# Patient Record
Sex: Female | Born: 1960
Health system: Southern US, Community
[De-identification: ages and names within clinical notes are randomized; demographics above are authoritative.]

## PROBLEM LIST (undated history)

## (undated) DIAGNOSIS — A159 Respiratory tuberculosis unspecified: Secondary | ICD-10-CM

## (undated) DIAGNOSIS — R569 Unspecified convulsions: Secondary | ICD-10-CM

## (undated) DIAGNOSIS — G8929 Other chronic pain: Secondary | ICD-10-CM

## (undated) DIAGNOSIS — I10 Essential (primary) hypertension: Secondary | ICD-10-CM

## (undated) HISTORY — DX: Respiratory tuberculosis unspecified: A15.9

## (undated) HISTORY — DX: Unspecified convulsions: R56.9

## (undated) HISTORY — DX: Other chronic pain: G89.29

---

## 2010-11-15 ENCOUNTER — Ambulatory Visit
Admission: RE | Admit: 2010-11-15 | Discharge: 2010-11-15 | Disposition: A | Discharge status: Home / Self Care | Payer: PRIVATE HEALTH INSURANCE | Source: Ambulatory Visit | Attending: Infectious Diseases | Admitting: Infectious Diseases

## 2010-11-15 ENCOUNTER — Other Ambulatory Visit: Payer: Self-pay | Admitting: Infectious Diseases

## 2010-11-15 DIAGNOSIS — R7611 Nonspecific reaction to tuberculin skin test without active tuberculosis: Secondary | ICD-10-CM

## 2010-12-01 ENCOUNTER — Emergency Department (HOSPITAL_COMMUNITY): Payer: Medicaid Other

## 2010-12-01 ENCOUNTER — Emergency Department (HOSPITAL_COMMUNITY)
Admission: EM | Admit: 2010-12-01 | Discharge: 2010-12-01 | Disposition: A | Discharge status: Home / Self Care | Payer: Medicaid Other | Attending: Emergency Medicine | Admitting: Emergency Medicine

## 2010-12-01 DIAGNOSIS — R0602 Shortness of breath: Secondary | ICD-10-CM | POA: Insufficient documentation

## 2010-12-01 DIAGNOSIS — R0789 Other chest pain: Secondary | ICD-10-CM | POA: Insufficient documentation

## 2010-12-01 DIAGNOSIS — M79609 Pain in unspecified limb: Secondary | ICD-10-CM | POA: Insufficient documentation

## 2010-12-01 LAB — CBC
HCT: 37.1 % (ref 36.0–46.0)
Hemoglobin: 13.2 g/dL (ref 12.0–15.0)
MCV: 92.8 fL (ref 78.0–100.0)
RBC: 4 MIL/uL (ref 3.87–5.11)
WBC: 8.4 10*3/uL (ref 4.0–10.5)

## 2010-12-01 LAB — COMPREHENSIVE METABOLIC PANEL
CO2: 25 mEq/L (ref 19–32)
Calcium: 8.9 mg/dL (ref 8.4–10.5)
Creatinine, Ser: 0.63 mg/dL (ref 0.50–1.10)
GFR calc Af Amer: >60 mL/min (ref >60)
GFR calc non Af Amer: >60 mL/min (ref >60)
Glucose, Bld: 100 mg/dL — ABNORMAL HIGH (ref 70–99)

## 2010-12-01 LAB — D-DIMER, QUANTITATIVE: D-Dimer, Quant: <0.22 ug/mL-FEU (ref 0.00–0.48)

## 2010-12-01 LAB — CK TOTAL AND CKMB (NOT AT ARMC): Relative Index: INVALID (ref 0.0–2.5)

## 2010-12-01 LAB — TROPONIN I: Troponin I: <0.3 ng/mL (ref <0.30)

## 2011-04-22 ENCOUNTER — Other Ambulatory Visit: Payer: Self-pay | Admitting: Internal Medicine

## 2011-04-22 DIAGNOSIS — Z1231 Encounter for screening mammogram for malignant neoplasm of breast: Secondary | ICD-10-CM

## 2011-05-10 ENCOUNTER — Ambulatory Visit
Admission: RE | Admit: 2011-05-10 | Discharge: 2011-05-10 | Disposition: A | Discharge status: Home / Self Care | Payer: Medicaid Other | Source: Ambulatory Visit | Attending: Internal Medicine | Admitting: Internal Medicine

## 2011-05-10 DIAGNOSIS — Z1231 Encounter for screening mammogram for malignant neoplasm of breast: Secondary | ICD-10-CM

## 2012-08-07 IMAGING — CR DG CHEST 1V PORT
1 series · 1 of 1 positions shown · non-contrast
Comparison: Chest radiograph performed 11/15/2010

CLINICAL DATA: Left-sided chest pain.

PORTABLE CHEST - 1 VIEW

[AP]
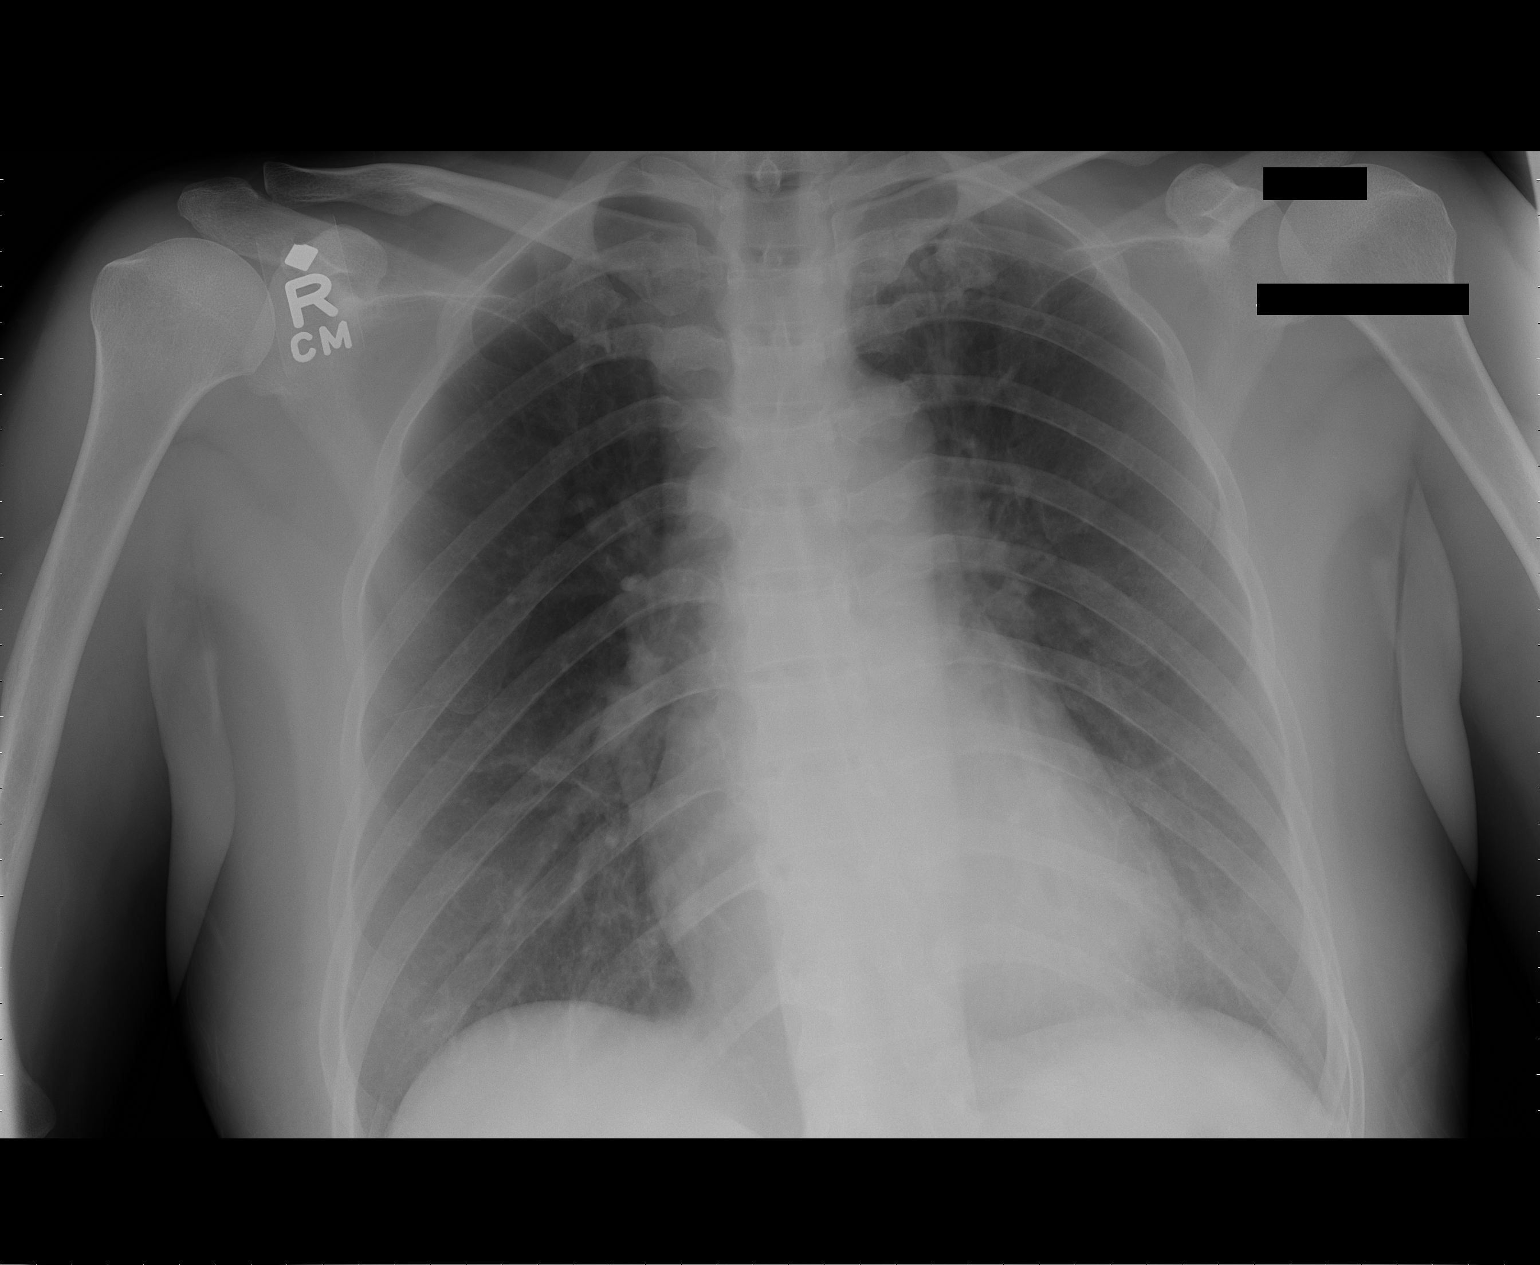

[1 of 1 positions shown; findings below may reference images not displayed]

FINDINGS: The lungs are well-aerated and clear.  There is no
evidence of focal opacification, pleural effusion or pneumothorax.
No definite left apical opacity is seen, after accounting for ribs
and costochondral cartilage.  Minimal right basilar scarring is
noted.

The cardiomediastinal silhouette is within normal limits.  No acute
osseous abnormalities are seen.
IMPRESSION: No acute cardiopulmonary process seen.

## 2013-07-18 ENCOUNTER — Ambulatory Visit: Payer: Self-pay | Admitting: Medical

## 2013-07-26 ENCOUNTER — Ambulatory Visit (INDEPENDENT_AMBULATORY_CARE_PROVIDER_SITE_OTHER): Payer: No Typology Code available for payment source | Admitting: Medical

## 2013-07-26 ENCOUNTER — Encounter: Payer: Self-pay | Admitting: Medical

## 2013-07-26 VITALS — BP 150/100 | HR 80 | Temp 98.2°F | Resp 20 | Ht 62.0 in | Wt 157.0 lb

## 2013-07-26 DIAGNOSIS — G8929 Other chronic pain: Secondary | ICD-10-CM

## 2013-07-26 DIAGNOSIS — M549 Dorsalgia, unspecified: Secondary | ICD-10-CM

## 2013-07-26 DIAGNOSIS — R030 Elevated blood-pressure reading, without diagnosis of hypertension: Secondary | ICD-10-CM

## 2013-07-26 DIAGNOSIS — R7301 Impaired fasting glucose: Secondary | ICD-10-CM

## 2013-07-26 DIAGNOSIS — R202 Paresthesia of skin: Secondary | ICD-10-CM

## 2013-07-26 DIAGNOSIS — R209 Unspecified disturbances of skin sensation: Secondary | ICD-10-CM

## 2013-07-26 LAB — CBC WITH DIFFERENTIAL/PLATELET
BASOS ABS: 0 10*3/uL (ref 0.0–0.1)
Basophils Relative: 0 % (ref 0–1)
EOS ABS: 0.1 10*3/uL (ref 0.0–0.7)
EOS PCT: 1 % (ref 0–5)
HCT: 44.4 % (ref 36.0–46.0)
Hemoglobin: 15.3 g/dL — ABNORMAL HIGH (ref 12.0–15.0)
LYMPHS ABS: 2.6 10*3/uL (ref 0.7–4.0)
LYMPHS PCT: 40 % (ref 12–46)
MCH: 32 pg (ref 26.0–34.0)
MCHC: 34.5 g/dL (ref 30.0–36.0)
MCV: 92.9 fL (ref 78.0–100.0)
Monocytes Absolute: 0.7 10*3/uL (ref 0.1–1.0)
Monocytes Relative: 11 % (ref 3–12)
NEUTROS PCT: 48 % (ref 43–77)
Neutro Abs: 3.1 10*3/uL (ref 1.7–7.7)
PLATELETS: 295 10*3/uL (ref 150–400)
RBC: 4.78 MIL/uL (ref 3.87–5.11)
RDW: 13.3 % (ref 11.5–15.5)
WBC: 6.5 10*3/uL (ref 4.0–10.5)

## 2013-07-26 LAB — COMPREHENSIVE METABOLIC PANEL
ALBUMIN: 3.9 g/dL (ref 3.5–5.2)
ALT: 77 U/L — AB (ref 0–35)
AST: 63 U/L — ABNORMAL HIGH (ref 0–37)
Alkaline Phosphatase: 79 U/L (ref 39–117)
BUN: 9 mg/dL (ref 6–23)
CHLORIDE: 102 meq/L (ref 96–112)
CO2: 25 meq/L (ref 19–32)
Calcium: 8.9 mg/dL (ref 8.4–10.5)
Creat: 0.77 mg/dL (ref 0.50–1.10)
GLUCOSE: 98 mg/dL (ref 70–99)
POTASSIUM: 3.9 meq/L (ref 3.5–5.3)
SODIUM: 136 meq/L (ref 135–145)
TOTAL PROTEIN: 7.5 g/dL (ref 6.0–8.3)
Total Bilirubin: 0.3 mg/dL (ref 0.2–1.2)

## 2013-07-26 LAB — GLUCOSE, POCT (MANUAL RESULT ENTRY): POC Glucose: 123 mg/dl — AB (ref 70–99)

## 2013-07-26 LAB — POCT GLYCOSYLATED HEMOGLOBIN (HGB A1C): Hemoglobin A1C: 6

## 2013-07-26 NOTE — Progress Notes (Signed)
   Subjective:   Michele SingletonLila Meza is a 53 y.o. female presenting on 07/26/2013 with Back Pain and blood pressure  Here as a new patient today, accompanied by her son and translator.  She is originally from [place]  She has several concerns.  1- burning sensation feet and eyes.  At a community clinic, checked glucose and it was 170, 32mo ago.  Checked glucose at home 130.   Eats ethnic food, green vegetables, rice, lentils, yogurt.  She does chew tobacco, drinks beer.     2- BP elevated a times.  Has been high at community clinic.  Gets headaches frequently at least 2-3 per week.    3- back problems, chronically.  No leg radicular pain, and other than burning in feet, no other tingling or weakness. Mainly numbness in feet.   No injury, trauma, fall.  Stretches regularly.  No c/o ROM problems.   No prior xrays.  No prior eval, doesn't take any medication.   No other aggravating or relieving factors.  No other complaint.  Review of Systems ROS as in subjective      Objective:     Filed Vitals:   07/26/13 1425  BP: 150/100  Pulse: 80  Temp: 98.2 F (36.8 C)  Resp: 20    General appearance: alert, no distress, WD/WN Neck: supple, no lymphadenopathy, no thyromegaly, no masses Heart: RRR, normal S1, S2, no murmurs Lungs: CTA bilaterally, no wheezes, rhonchi, or rales Abdomen: +bs, soft, non tender, non distended, no masses, no hepatomegaly, no splenomegaly Pulses: 2+ symmetric, upper and lower extremities, normal cap refill Ext: no edema Neuro: She has some sensation with monofilament of the right foot but both feet seem to have decreased monofilament sensation and general decreased sensation, somewhat decreased vibration sense in toes, normal DTR Back nontender, normal range of motion, negative straight-leg raise, normal heel and toe walk      Assessment: Encounter Diagnoses  Name Primary?  . Elevated blood pressure reading without diagnosis of hypertension Yes  . Impaired  fasting blood sugar   . Chronic back pain   . Paresthesia of both feet      Plan: We discussed the findings today including elevated blood pressure, glucose 123, hemoglobin A1c 6.0%, abnormal sensation of bilateral feet suggestive of neuropathy. He discussed possible causes which likely include impaired fasting glucose, insulin resistance.   After much discussion and consideration of medication, we will check additional labs, she will cut out soda, she will try to start exercising and limiting salt.  We will see her back in 2 weeks. At that time is still elevated blood pressure, consider beginning metformin and lisinopril. Consider gabapentin or nerve conduction studies  Michele Meza was seen today for back pain and blood pressure.  Diagnoses and associated orders for this visit:  Elevated blood pressure reading without diagnosis of hypertension - Vitamin B12 - Comprehensive metabolic panel - CBC with Differential - TSH  Impaired fasting blood sugar - HgB A1c - Glucose (CBG) - Vitamin B12 - Comprehensive metabolic panel - CBC with Differential - TSH  Chronic back pain - Vitamin B12 - Comprehensive metabolic panel - CBC with Differential - TSH  Paresthesia of both feet - Vitamin B12 - Comprehensive metabolic panel - CBC with Differential - TSH    Return pending labs.

## 2013-07-27 LAB — TSH: TSH: 1.083 u[IU]/mL (ref 0.350–4.500)

## 2013-07-27 LAB — VITAMIN B12: VITAMIN B 12: 548 pg/mL (ref 211–911)

## 2013-07-29 ENCOUNTER — Encounter: Payer: Self-pay | Admitting: Family Medicine

## 2014-09-15 ENCOUNTER — Encounter: Payer: Self-pay | Admitting: Family Medicine

## 2014-09-15 ENCOUNTER — Ambulatory Visit (INDEPENDENT_AMBULATORY_CARE_PROVIDER_SITE_OTHER): Payer: Medicaid Other | Admitting: Family Medicine

## 2014-09-15 ENCOUNTER — Other Ambulatory Visit: Payer: Self-pay | Admitting: Family Medicine

## 2014-09-15 VITALS — BP 142/88 | HR 79 | Temp 98.5°F | Resp 16 | Ht 62.0 in | Wt 158.0 lb

## 2014-09-15 DIAGNOSIS — Z789 Other specified health status: Secondary | ICD-10-CM

## 2014-09-15 DIAGNOSIS — I10 Essential (primary) hypertension: Secondary | ICD-10-CM | POA: Insufficient documentation

## 2014-09-15 DIAGNOSIS — R7303 Prediabetes: Secondary | ICD-10-CM | POA: Insufficient documentation

## 2014-09-15 DIAGNOSIS — Z9289 Personal history of other medical treatment: Secondary | ICD-10-CM | POA: Diagnosis not present

## 2014-09-15 DIAGNOSIS — R7309 Other abnormal glucose: Secondary | ICD-10-CM

## 2014-09-15 DIAGNOSIS — Z23 Encounter for immunization: Secondary | ICD-10-CM | POA: Diagnosis not present

## 2014-09-15 LAB — POCT URINALYSIS DIP (DEVICE)
Bilirubin Urine: NEGATIVE
GLUCOSE, UA: NEGATIVE mg/dL
HGB URINE DIPSTICK: NEGATIVE
KETONES UR: NEGATIVE mg/dL
NITRITE: NEGATIVE
PH: 5 (ref 5.0–8.0)
PROTEIN: NEGATIVE mg/dL
SPECIFIC GRAVITY, URINE: 1.025 (ref 1.005–1.030)
Urobilinogen, UA: 0.2 mg/dL (ref 0.0–1.0)

## 2014-09-15 LAB — COMPLETE METABOLIC PANEL WITH GFR
ALBUMIN: 3.9 g/dL (ref 3.5–5.2)
ALT: 94 U/L — ABNORMAL HIGH (ref 0–35)
AST: 66 U/L — ABNORMAL HIGH (ref 0–37)
Alkaline Phosphatase: 92 U/L (ref 39–117)
BUN: 11 mg/dL (ref 6–23)
CO2: 20 meq/L (ref 19–32)
Calcium: 9.3 mg/dL (ref 8.4–10.5)
Chloride: 103 mEq/L (ref 96–112)
Creat: 0.77 mg/dL (ref 0.50–1.10)
GFR, Est African American: >89 mL/min
GFR, Est Non African American: 89 mL/min
Glucose, Bld: 79 mg/dL (ref 70–99)
Potassium: 4 mEq/L (ref 3.5–5.3)
SODIUM: 136 meq/L (ref 135–145)
TOTAL PROTEIN: 7.4 g/dL (ref 6.0–8.3)
Total Bilirubin: 0.5 mg/dL (ref 0.2–1.2)

## 2014-09-15 LAB — HEMOGLOBIN A1C
HEMOGLOBIN A1C: 6.3 % — AB (ref <5.7)
Mean Plasma Glucose: 134 mg/dL — ABNORMAL HIGH (ref <117)

## 2014-09-15 MED ORDER — AMLODIPINE BESYLATE 5 MG PO TABS: 5.0000 mg | ORAL_TABLET | Freq: Every day | ORAL | Status: DC

## 2014-09-15 NOTE — Progress Notes (Signed)
Subjective:    Patient ID: Michele Meza, female    DOB: [birth year], 54 y.o.   MRN: [id number]  HPI  Patient primarily speaks Nepali and is accompanied by an interpreter. Patient states that she has not had a primarily physician [place].   Patient has a history of hypertension.Patient here for follow-up of elevated  She is not exercising and is not adherent to low salt diet.  Blood pressure is not well controlled at home. Patient denies dizziness,  chest pain, chest pressure/discomfort, fatigue, lower extremity edema, near-syncope, palpitations, syncope and tachypnea.  Cardiovascular risk factors: sedentary lifestyle.   No past medical history on file. History   Social History  . Marital Status: Single    Spouse Name: N/A  . Number of Children: N/A  . Years of Education: N/A   Occupational History  . Not on file.   Social History Main Topics  . Smoking status: Never Smoker   . Smokeless tobacco: Current User    Types: Chew  . Alcohol Use: Not on file  . Drug Use: Not on file  . Sexual Activity: Not on file   Other Topics Concern  . Not on file   Social History Narrative  No Known Allergies  Immunization History  Administered Date(s) Administered  . Tdap 09/15/2014    Review of Systems  Constitutional: Negative.   HENT: Negative.   Eyes: Negative.   Respiratory: Negative.   Cardiovascular: Negative.   Gastrointestinal: Negative.   Endocrine: Negative.   Genitourinary: Negative.   Musculoskeletal: Negative.   Skin: Negative.   Allergic/Immunologic: Negative.   Neurological: Negative.   Hematological: Negative.   Psychiatric/Behavioral: Negative.        Objective:   Physical Exam  Constitutional: She is oriented to person, place, and time. She appears well-developed and well-nourished.  HENT:  Head: Normocephalic and atraumatic.  Right Ear: External ear normal.  Left Ear: External ear normal.  Mouth/Throat: Oropharynx is clear  and moist.  Eyes: Conjunctivae and EOM are normal. Pupils are equal, round, and reactive to light.  Neck: Normal range of motion. Neck supple.  Cardiovascular: Normal rate, regular rhythm, normal heart sounds and intact distal pulses.   Pulmonary/Chest: Effort normal and breath sounds normal.  Abdominal: Soft. Bowel sounds are normal.  Musculoskeletal: Normal range of motion.  Neurological: She is alert and oriented to person, place, and time. She has normal reflexes.  Skin: Skin is warm and dry.  Psychiatric: She has a normal mood and affect. Her behavior is normal. Judgment and thought content normal.      BP 153/88 mmHg  Pulse 79  Temp(Src) 98.5 F (36.9 C) (Oral)  Resp 16  Ht  (1.575 m)  Wt 158 lb (71.668 kg)  BMI 28.89 kg/m2 Assessment & Plan:  1. Essential hypertension Patient is currently not on medications for hypertension. Reviewed previous medical record and patient has a history of hypertension. Will check for proteinuria, BUN, and creatinine. Will start Amlodipine 5 mg daily, will follow up with patient in 1 month.  - COMPLETE METABOLIC PANEL WITH GFR - POCT urinalysis dipstick - Lipid Panel; Future - amLODipine (NORVASC) 5 MG tablet; Take 1 tablet (5 mg total) by mouth daily.  Dispense: 30 tablet; Refill: 0 2. Prediabetes  Patient has a history of pre-diabetes, previous hemoglobin A1C is 6.0. Discussed diet exercise at length.  - Hemoglobin A1c  3. Need for Tdap vaccination - Tdap vaccine greater than or equal  to 7yo IM  4. H/O mammogram  - MM DIGITAL SCREENING BILATERAL; Future  5. Language barrier Patient primarily speaks Nepali, she is accompanied by interpreter to assist with communication.   RTC: 1 month for hypertension follow up  Massie MaroonHollis,Lachina M, FNP

## 2014-09-15 NOTE — Patient Instructions (Signed)
DASH Eating Plan DASH stands for "Dietary Approaches to Stop Hypertension." The DASH eating plan is a healthy eating plan that has been shown to reduce high blood pressure (hypertension). Additional health benefits may include reducing the risk of type 2 diabetes mellitus, heart disease, and stroke. The DASH eating plan may also help with weight loss. WHAT DO I NEED TO KNOW ABOUT THE DASH EATING PLAN? For the DASH eating plan, you will follow these general guidelines:  Choose foods with a percent daily value for sodium of less than 5% (as listed on the food label).  Use salt-free seasonings or herbs instead of table salt or sea salt.  Check with your health care provider or pharmacist before using salt substitutes.  Eat lower-sodium products, often labeled as "lower sodium" or "no salt added."  Eat fresh foods.  Eat more vegetables, fruits, and low-fat dairy products.  Choose whole grains. Look for the word "whole" as the first word in the ingredient list.  Choose fish and skinless chicken or turkey more often than red meat. Limit fish, poultry, and meat to 6 oz (170 g) each day.  Limit sweets, desserts, sugars, and sugary drinks.  Choose heart-healthy fats.  Limit cheese to 1 oz (28 g) per day.  Eat more home-cooked food and less restaurant, buffet, and fast food.  Limit fried foods.  Cook foods using methods other than frying.  Limit canned vegetables. If you do use them, rinse them well to decrease the sodium.  When eating at a restaurant, ask that your food be prepared with less salt, or no salt if possible. WHAT FOODS CAN I EAT? Seek help from a dietitian for individual calorie needs. Grains Whole grain or whole wheat bread. Brown rice. Whole grain or whole wheat pasta. Quinoa, bulgur, and whole grain cereals. Low-sodium cereals. Corn or whole wheat flour tortillas. Whole grain cornbread. Whole grain crackers. Low-sodium crackers. Vegetables Fresh or frozen vegetables  (raw, steamed, roasted, or grilled). Low-sodium or reduced-sodium tomato and vegetable juices. Low-sodium or reduced-sodium tomato sauce and paste. Low-sodium or reduced-sodium canned vegetables.  Fruits All fresh, canned (in natural juice), or frozen fruits. Meat and Other Protein Products Ground beef (85% or leaner), grass-fed beef, or beef trimmed of fat. Skinless chicken or turkey. Ground chicken or turkey. Pork trimmed of fat. All fish and seafood. Eggs. Dried beans, peas, or lentils. Unsalted nuts and seeds. Unsalted canned beans. Dairy Low-fat dairy products, such as skim or 1% milk, 2% or reduced-fat cheeses, low-fat ricotta or cottage cheese, or plain low-fat yogurt. Low-sodium or reduced-sodium cheeses. Fats and Oils Tub margarines without trans fats. Light or reduced-fat mayonnaise and salad dressings (reduced sodium). Avocado. Safflower, olive, or canola oils. Natural peanut or almond butter. Other Unsalted popcorn and pretzels. The items listed above may not be a complete list of recommended foods or beverages. Contact your dietitian for more options. WHAT FOODS ARE NOT RECOMMENDED? Grains White bread. White pasta. White rice. Refined cornbread. Bagels and croissants. Crackers that contain trans fat. Vegetables Creamed or fried vegetables. Vegetables in a cheese sauce. Regular canned vegetables. Regular canned tomato sauce and paste. Regular tomato and vegetable juices. Fruits Dried fruits. Canned fruit in light or heavy syrup. Fruit juice. Meat and Other Protein Products Fatty cuts of meat. Ribs, chicken wings, bacon, sausage, bologna, salami, chitterlings, fatback, hot dogs, bratwurst, and packaged luncheon meats. Salted nuts and seeds. Canned beans with salt. Dairy Whole or 2% milk, cream, half-and-half, and cream cheese. Whole-fat or sweetened yogurt. Full-fat   cheeses or blue cheese. Nondairy creamers and whipped toppings. Processed cheese, cheese spreads, or cheese  curds. Condiments Onion and garlic salt, seasoned salt, table salt, and sea salt. Canned and packaged gravies. Worcestershire sauce. Tartar sauce. Barbecue sauce. Teriyaki sauce. Soy sauce, including reduced sodium. Steak sauce. Fish sauce. Oyster sauce. Cocktail sauce. Horseradish. Ketchup and mustard. Meat flavorings and tenderizers. Bouillon cubes. Hot sauce. Tabasco sauce. Marinades. Taco seasonings. Relishes. Fats and Oils Butter, stick margarine, lard, shortening, ghee, and bacon fat. Coconut, palm kernel, or palm oils. Regular salad dressings. Other Pickles and olives. Salted popcorn and pretzels. The items listed above may not be a complete list of foods and beverages to avoid. Contact your dietitian for more information. WHERE CAN I FIND MORE INFORMATION? National Heart, Lung, and [employer] Institute: travelstabloid.com Document Released: 03/17/2011 Document Revised: 08/12/2013 Document Reviewed: 01/30/2013 [facility] Patient Information 2015 [place], [place]. This information is not intended to replace advice given to you by your health care provider. Make sure you discuss any questions you have with your health care provider. Hypertension Hypertension, commonly called high blood pressure, is when the force of blood pumping through your arteries is too strong. Your arteries are the blood vessels that carry blood from your heart throughout your body. A blood pressure reading consists of a higher number over a lower number, such as 110/72. The higher number (systolic) is the pressure inside your arteries when your heart pumps. The lower number (diastolic) is the pressure inside your arteries when your heart relaxes. Ideally you want your blood pressure below 120/80. Hypertension forces your heart to work harder to pump blood. Your arteries may become narrow or stiff. Having hypertension puts you at risk for heart disease, stroke, and other problems.  RISK  FACTORS Some risk factors for high blood pressure are controllable. Others are not.  Risk factors you cannot control include:   Race. You may be at higher risk if you are African American.  Age. Risk increases with age.  Gender. Men are at higher risk than women before age 37 years. After age 55, women are at higher risk than men. Risk factors you can control include:  Not getting enough exercise or physical activity.  Being overweight.  Getting too much fat, sugar, calories, or salt in your diet.  Drinking too much alcohol. SIGNS AND SYMPTOMS Hypertension does not usually cause signs or symptoms. Extremely high blood pressure (hypertensive crisis) may cause headache, anxiety, shortness of breath, and nosebleed. DIAGNOSIS  To check if you have hypertension, your health care provider will measure your blood pressure while you are seated, with your arm held at the level of your heart. It should be measured at least twice using the same arm. Certain conditions can cause a difference in blood pressure between your right and left arms. A blood pressure reading that is higher than normal on one occasion does not mean that you need treatment. If one blood pressure reading is high, ask your health care provider about having it checked again. TREATMENT  Treating high blood pressure includes making lifestyle changes and possibly taking medicine. Living a healthy lifestyle can help lower high blood pressure. You may need to change some of your habits. Lifestyle changes may include:  Following the DASH diet. This diet is high in fruits, vegetables, and whole grains. It is low in salt, red meat, and added sugars.  Getting at least 2 hours of brisk physical activity every week.  Losing weight if necessary.  Not smoking.  Limiting  alcoholic beverages.  Learning ways to reduce stress. If lifestyle changes are not enough to get your blood pressure under control, your health care provider may  prescribe medicine. You may need to take more than one. Work closely with your health care provider to understand the risks and benefits. HOME CARE INSTRUCTIONS  Have your blood pressure rechecked as directed by your health care provider.   Take medicines only as directed by your health care provider. Follow the directions carefully. Blood pressure medicines must be taken as prescribed. The medicine does not work as well when you skip doses. Skipping doses also puts you at risk for problems.   Do not smoke.   Monitor your blood pressure at home as directed by your health care provider. SEEK MEDICAL CARE IF:   You think you are having a reaction to medicines taken.  You have recurrent headaches or feel dizzy.  You have swelling in your ankles.  You have trouble with your vision. SEEK IMMEDIATE MEDICAL CARE IF:  You develop a severe headache or confusion.  You have unusual weakness, numbness, or feel faint.  You have severe chest or abdominal pain.  You vomit repeatedly.  You have trouble breathing. MAKE SURE YOU:   Understand these instructions.  Will watch your condition.  Will get help right away if you are not doing well or get worse. Document Released: 03/28/2005 Document Revised: 08/12/2013 Document Reviewed: 01/18/2013 [facility] Patient Information 2015 [place], [place]. This information is not intended to replace advice given to you by your health care provider. Make sure you discuss any questions you have with your health care provider. Managing Your High Blood Pressure Blood pressure is a measurement of how forceful your blood is pressing against the walls of the arteries. Arteries are muscular tubes within the circulatory system. Blood pressure does not stay the same. Blood pressure rises when you are active, excited, or nervous; and it lowers during sleep and relaxation. If the numbers measuring your blood pressure stay above normal most of the time, you are at  risk for health problems. High blood pressure (hypertension) is a long-term (chronic) condition in which blood pressure is elevated. A blood pressure reading is recorded as two numbers, such as 120 over 80 (or 120/80). The first, higher number is called the systolic pressure. It is a measure of the pressure in your arteries as the heart beats. The second, lower number is called the diastolic pressure. It is a measure of the pressure in your arteries as the heart relaxes between beats.  Keeping your blood pressure in a normal range is important to your overall health and prevention of health problems, such as heart disease and stroke. When your blood pressure is uncontrolled, your heart has to work harder than normal. High blood pressure is a very common condition in adults because blood pressure tends to rise with age. Men and women are equally likely to have hypertension but at different times in life. Before age 18, men are more likely to have hypertension. After 52 years of age, women are more likely to have it. Hypertension is especially common in African Americans. This condition often has no signs or symptoms. The cause of the condition is usually not known. Your caregiver can help you come up with a plan to keep your blood pressure in a normal, healthy range. BLOOD PRESSURE STAGES Blood pressure is classified into four stages: normal, prehypertension, stage 1, and stage 2. Your blood pressure reading will be used to determine  what type of treatment, if any, is necessary. Appropriate treatment options are tied to these four stages:  Normal  Systolic pressure (mm Hg): below 120.  Diastolic pressure (mm Hg): below 80. Prehypertension  Systolic pressure (mm Hg): 120 to 139.  Diastolic pressure (mm Hg): 80 to 89. Stage1  Systolic pressure (mm Hg): 140 to 159.  Diastolic pressure (mm Hg): 90 to 99. Stage2  Systolic pressure (mm Hg): 160 or above.  Diastolic pressure (mm Hg): 100 or  above. RISKS RELATED TO HIGH BLOOD PRESSURE Managing your blood pressure is an important responsibility. Uncontrolled high blood pressure can lead to:  A heart attack.  A stroke.  A weakened blood vessel (aneurysm).  Heart failure.  Kidney damage.  Eye damage.  Metabolic syndrome.  Memory and concentration problems. HOW TO MANAGE YOUR BLOOD PRESSURE Blood pressure can be managed effectively with lifestyle changes and medicines (if needed). Your caregiver will help you come up with a plan to bring your blood pressure within a normal range. Your plan should include the following: Education  Read all information provided by your caregivers about how to control blood pressure.  Educate yourself on the latest guidelines and treatment recommendations. New research is always being done to further define the risks and treatments for high blood pressure. Lifestylechanges  Control your weight.  Avoid smoking.  Stay physically active.  Reduce the amount of salt in your diet.  Reduce stress.  Control any chronic conditions, such as high cholesterol or diabetes.  Reduce your alcohol intake. Medicines  Several medicines (antihypertensive medicines) are available, if needed, to bring blood pressure within a normal range. Communication  Review all the medicines you take with your caregiver because there may be side effects or interactions.  Talk with your caregiver about your diet, exercise habits, and other lifestyle factors that may be contributing to high blood pressure.  See your caregiver regularly. Your caregiver can help you create and adjust your plan for managing high blood pressure. RECOMMENDATIONS FOR TREATMENT AND FOLLOW-UP  The following recommendations are based on current guidelines for managing high blood pressure in nonpregnant adults. Use these recommendations to identify the proper follow-up period or treatment option based on your blood pressure reading. You  can discuss these options with your caregiver.  Systolic pressure of 120 to 139 or diastolic pressure of 80 to 89: Follow up with your caregiver as directed.  Systolic pressure of 140 to 160 or diastolic pressure of 90 to 100: Follow up with your caregiver within 2 months.  Systolic pressure above 160 or diastolic pressure above 100: Follow up with your caregiver within 1 month.  Systolic pressure above 180 or diastolic pressure above 110: Consider antihypertensive therapy; follow up with your caregiver within 1 week.  Systolic pressure above 200 or diastolic pressure above 120: Begin antihypertensive therapy; follow up with your caregiver within 1 week. Document Released: 12/21/2011 Document Reviewed: 12/21/2011 [facility] Patient Information 2015 [place], [place]. This information is not intended to replace advice given to you by your health care provider. Make sure you discuss any questions you have with your health care provider. Managing Your High Blood Pressure Blood pressure is a measurement of how forceful your blood is pressing against the walls of the arteries. Arteries are muscular tubes within the circulatory system. Blood pressure does not stay the same. Blood pressure rises when you are active, excited, or nervous; and it lowers during sleep and relaxation. If the numbers measuring your blood pressure stay above normal most  of the time, you are at risk for health problems. High blood pressure (hypertension) is a long-term (chronic) condition in which blood pressure is elevated. A blood pressure reading is recorded as two numbers, such as 120 over 80 (or 120/80). The first, higher number is called the systolic pressure. It is a measure of the pressure in your arteries as the heart beats. The second, lower number is called the diastolic pressure. It is a measure of the pressure in your arteries as the heart relaxes between beats.  Keeping your blood pressure in a normal range is important to  your overall health and prevention of health problems, such as heart disease and stroke. When your blood pressure is uncontrolled, your heart has to work harder than normal. High blood pressure is a very common condition in adults because blood pressure tends to rise with age. Men and women are equally likely to have hypertension but at different times in life. Before age 52, men are more likely to have hypertension. After 52 years of age, women are more likely to have it. Hypertension is especially common in African Americans. This condition often has no signs or symptoms. The cause of the condition is usually not known. Your caregiver can help you come up with a plan to keep your blood pressure in a normal, healthy range. BLOOD PRESSURE STAGES Blood pressure is classified into four stages: normal, prehypertension, stage 1, and stage 2. Your blood pressure reading will be used to determine what type of treatment, if any, is necessary. Appropriate treatment options are tied to these four stages:  Normal  Systolic pressure (mm Hg): below 120.  Diastolic pressure (mm Hg): below 80. Prehypertension  Systolic pressure (mm Hg): 120 to 139.  Diastolic pressure (mm Hg): 80 to 89. Stage1  Systolic pressure (mm Hg): 140 to 159.  Diastolic pressure (mm Hg): 90 to 99. Stage2  Systolic pressure (mm Hg): 160 or above.  Diastolic pressure (mm Hg): 100 or above. RISKS RELATED TO HIGH BLOOD PRESSURE Managing your blood pressure is an important responsibility. Uncontrolled high blood pressure can lead to:  A heart attack.  A stroke.  A weakened blood vessel (aneurysm).  Heart failure.  Kidney damage.  Eye damage.  Metabolic syndrome.  Memory and concentration problems. HOW TO MANAGE YOUR BLOOD PRESSURE Blood pressure can be managed effectively with lifestyle changes and medicines (if needed). Your caregiver will help you come up with a plan to bring your blood pressure within a normal  range. Your plan should include the following: Education  Read all information provided by your caregivers about how to control blood pressure.  Educate yourself on the latest guidelines and treatment recommendations. New research is always being done to further define the risks and treatments for high blood pressure. Lifestylechanges  Control your weight.  Avoid smoking.  Stay physically active.  Reduce the amount of salt in your diet.  Reduce stress.  Control any chronic conditions, such as high cholesterol or diabetes.  Reduce your alcohol intake. Medicines  Several medicines (antihypertensive medicines) are available, if needed, to bring blood pressure within a normal range. Communication  Review all the medicines you take with your caregiver because there may be side effects or interactions.  Talk with your caregiver about your diet, exercise habits, and other lifestyle factors that may be contributing to high blood pressure.  See your caregiver regularly. Your caregiver can help you create and adjust your plan for managing high blood pressure. RECOMMENDATIONS FOR TREATMENT AND  FOLLOW-UP  The following recommendations are based on current guidelines for managing high blood pressure in nonpregnant adults. Use these recommendations to identify the proper follow-up period or treatment option based on your blood pressure reading. You can discuss these options with your caregiver.  Systolic pressure of 120 to 139 or diastolic pressure of 80 to 89: Follow up with your caregiver as directed.  Systolic pressure of 140 to 160 or diastolic pressure of 90 to 100: Follow up with your caregiver within 2 months.  Systolic pressure above 160 or diastolic pressure above 100: Follow up with your caregiver within 1 month.  Systolic pressure above 180 or diastolic pressure above 110: Consider antihypertensive therapy; follow up with your caregiver within 1 week.  Systolic pressure above  200 or diastolic pressure above 120: Begin antihypertensive therapy; follow up with your caregiver within 1 week. Document Released: 12/21/2011 Document Reviewed: 12/21/2011 [employer] Patient Information 2015 [place], [place]. This information is not intended to replace advice given to you by your health care provider. Make sure you discuss any questions you have with your health care provider.

## 2014-09-16 ENCOUNTER — Telehealth: Payer: Self-pay | Admitting: Family Medicine

## 2014-09-16 ENCOUNTER — Encounter: Payer: Self-pay | Admitting: Family Medicine

## 2014-09-16 NOTE — Telephone Encounter (Signed)
Michele Meza, a 54 year old patient with a history of hypertension and prediabetes was evaluated in the clinic on 09/15/2014. Reviewed labs, liver enzymes increased, will add on hepatitis panel. Also, hemoglobin A1C increased to 6.3 from 6.0. Recommend a low fat, low carbohydrate diet divided over 6 small meals, increase water intake to 6-8 glasses per day, and exercise (walking regimen) 3-4 days per week for 30 minutes. Will follow up with patient for hypertension in 1 month as scheduled.    Massie MaroonHollis,Malvika Tung M, FNP

## 2014-09-17 LAB — HEPATITIS PANEL, ACUTE
HCV AB: NEGATIVE
HEP A IGM: NONREACTIVE
Hepatitis B Surface Ag: NEGATIVE

## 2014-09-18 ENCOUNTER — Ambulatory Visit: Payer: Medicaid Other | Admitting: Family Medicine

## 2014-09-19 NOTE — Telephone Encounter (Signed)
Call using Language Resource line, Interpreter ID [phone] Left message for patient to return call regarding lab work. Thanks!

## 2014-10-15 ENCOUNTER — Ambulatory Visit: Payer: No Typology Code available for payment source | Admitting: Family Medicine

## 2014-10-20 ENCOUNTER — Ambulatory Visit (INDEPENDENT_AMBULATORY_CARE_PROVIDER_SITE_OTHER): Payer: No Typology Code available for payment source | Admitting: Family Medicine

## 2014-10-20 VITALS — BP 140/89 | HR 73 | Temp 98.5°F | Resp 16 | Ht 62.0 in | Wt 161.0 lb

## 2014-10-20 DIAGNOSIS — I10 Essential (primary) hypertension: Secondary | ICD-10-CM

## 2014-10-20 LAB — LIPID PANEL
CHOLESTEROL: 200 mg/dL (ref 0–200)
HDL: 47 mg/dL (ref >=46)
LDL CALC: 131 mg/dL — AB (ref 0–99)
Total CHOL/HDL Ratio: 4.3 Ratio
Triglycerides: 110 mg/dL (ref <150)
VLDL: 22 mg/dL (ref 0–40)

## 2014-10-20 MED ORDER — AMLODIPINE BESYLATE 5 MG PO TABS: 5.0000 mg | ORAL_TABLET | Freq: Every day | ORAL | Status: DC

## 2014-10-20 NOTE — Patient Instructions (Signed)
Continue amlodipine at 5 mg daily Careful of salt in diet Try to walk some 5/7 days Have drawn bloodwork. Will call if there is anything abnormal.

## 2014-10-20 NOTE — Progress Notes (Signed)
Subjective:     Patient ID: Michele Meza, female   DOB: 12/01/1960, 54 y.o.   MRN: [id number]  HPI   Patient presents for follow-up of BP and to have labs drawn. She was seen by Ms. Hart RochesterHollis about a month ago, was started on medications for hypertension and told to return fasting for labs. She reports she has been taking her medications regularly but did not take today as she was told not to eat or drink anything before coming. She reports no change in her condition.  Her only complaint is burning of the soles of her feet when she goes to bed. This has been going on for over two years. She reports no abnormal sensation elsewhere.   Review of Systems   Negative for fever, chills, loss of appetite Negative for skin rashes or lesions. Positive for burning sensation of soles of feet Negative for chest pain or palpitations Negative for shortness of breath or cough. Negative for abd pain, n, v, d     Objective:   Physical Exam  Alert, oriented, appropriate in no distress Head, normocephalic, atraumatic Neck supple FROM w/o adenopathy or tenderness Lungs are clear to auscultation HS are regular, w/o m,g,r   ,  Assessment Plan     Hypertension, control unknown as she has not had her medication today. I have reminded her to take her medication on the morning of her next visit so that we may determine how well it is working. Have reminded her to be careful of salt in diet Follow-up in one month.  Burning soles of feet -Will review labs for any possible cause.

## 2014-11-24 ENCOUNTER — Other Ambulatory Visit: Payer: Self-pay

## 2014-11-24 ENCOUNTER — Ambulatory Visit (INDEPENDENT_AMBULATORY_CARE_PROVIDER_SITE_OTHER): Payer: No Typology Code available for payment source | Admitting: Family Medicine

## 2014-11-24 VITALS — BP 140/90 | HR 82 | Temp 98.3°F | Resp 14 | Ht 62.0 in | Wt 159.0 lb

## 2014-11-24 DIAGNOSIS — I10 Essential (primary) hypertension: Secondary | ICD-10-CM

## 2014-11-24 MED ORDER — AMLODIPINE BESYLATE 5 MG PO TABS: 5.0000 mg | ORAL_TABLET | Freq: Every day | ORAL | Status: DC

## 2014-11-24 NOTE — Progress Notes (Signed)
Patient ID: Michele Meza, female   DOB: 1961/03/10, 54 y.o.   MRN: [id number]   [name], is a 54 y.o. female  [id number]  [id number]  DOB - [birth year]  CC: No chief complaint on file.        HPI: Michele Meza is a 54 y.o. female here for 3 month follow-up of hypertension. She is on Norvasc 5 mg daily. Her blood pressure at last visit was 140/89 but she has not taken her medication that  No Known Allergies No past medical history on file. Current Outpatient Prescriptions on File Prior to Visit  Medication Sig Dispense Refill  . amLODipine (NORVASC) 5 MG tablet Take 1 tablet (5 mg total) by mouth daily. 30 tablet 0   No current facility-administered medications on file prior to visit.   No family history on file. Social History   Social History  . Marital Status: Single    Spouse Name: N/A  . Number of Children: N/A  . Years of Education: N/A   Occupational History  . Not on file.   Social History Main Topics  . Smoking status: Never Smoker   . Smokeless tobacco: Current User    Types: Chew  . Alcohol Use: Not on file  . Drug Use: Not on file  . Sexual Activity: Not on file   Other Topics Concern  . Not on file   Social History Narrative    Review of Systems: Constitutional: Negative for weight change, appetite change, fatigue Skin: Negative for ulcerations. Eyes: Negative pain, visual disturbance Respiratory: Negative for cough, shortness of breath,   Cardiovascular: Negative for chest pain, palpitations, pedal edema  Abdominal:Negative for nausea, vomiting, diarhea,constipation Genitourinary: Negative for frequency, urgency, polyuria Neurological: Negative for numbness, tingling,pain in feet Psychiatric/Behavioral: Negative for depression, anxiety, confusion   Objective:  There were no vitals filed for this visit.  Physical Exam: Constitutional: Patient appears well-developed and well-nourished. No distress. HENT: Normocephalic, atraumatic.  Oropharynx is clear and moist.  Eyes: Conjunctivae and EOM are normal. PERRLA, no scleral icterus. Neck: Normal ROM. Neck supple. No lymphadenopathy, No thyromegaly. CVS: RRR, S1/S2 +, no murmurs, no gallops, no rubs Pulmonary: Effort and breath sounds normal, no stridor, rhonchi, wheezes, rales.  Abdominal: Soft. Normoactive BS,, no distension, tenderness, rebound or guarding.  Musculoskeletal: Normal range of motion. No edema and no tenderness.  Neuro: Alert.Normal muscle tone coordination. Non-focal Skin: Skin is warm and dry. No rash noted. Not diaphoretic. No erythema. No pallor. Psychiatric: Normal mood and affect. Behavior, judgment, thought content normal.  Lab Results  Component Value Date   WBC 6.5 07/26/2013   HGB 15.3* 07/26/2013   HCT 44.4 07/26/2013   MCV 92.9 07/26/2013   PLT 295 07/26/2013   Lab Results  Component Value Date   CREATININE 0.77 09/15/2014   BUN 11 09/15/2014   NA 136 09/15/2014   K 4.0 09/15/2014   CL 103 09/15/2014   CO2 20 09/15/2014    Lab Results  Component Value Date   HGBA1C 6.3* 09/15/2014   Lipid Panel     Component Value Date/Time   CHOL 200 10/20/2014 1036   TRIG 110 10/20/2014 1036   HDL 47 10/20/2014 1036   CHOLHDL 4.3 10/20/2014 1036   VLDL 22 10/20/2014 1036   LDLCALC 131* 10/20/2014 1036       Assessment and plan:        No Follow-up on file.  The patient was given clear instructions to go to ER or return to medical  center if symptoms don't improve, worsen or new problems develop. The patient verbalized understanding. The patient was told to call to get lab results if they haven't heard anything in the next week.         11/24/2014, 8:28 AM

## 2014-12-01 ENCOUNTER — Encounter: Payer: Self-pay | Admitting: Family Medicine

## 2014-12-05 ENCOUNTER — Ambulatory Visit: Payer: No Typology Code available for payment source

## 2014-12-16 ENCOUNTER — Ambulatory Visit: Payer: No Typology Code available for payment source | Attending: Internal Medicine

## 2014-12-29 ENCOUNTER — Ambulatory Visit (INDEPENDENT_AMBULATORY_CARE_PROVIDER_SITE_OTHER): Payer: No Typology Code available for payment source | Admitting: Family Medicine

## 2014-12-29 ENCOUNTER — Encounter: Payer: Self-pay | Admitting: Family Medicine

## 2014-12-29 VITALS — BP 152/81 | HR 85 | Temp 97.5°F | Resp 16 | Ht 62.0 in | Wt 159.0 lb

## 2014-12-29 DIAGNOSIS — I10 Essential (primary) hypertension: Secondary | ICD-10-CM

## 2014-12-29 DIAGNOSIS — Z23 Encounter for immunization: Secondary | ICD-10-CM

## 2014-12-29 DIAGNOSIS — Z0000 Encounter for general adult medical examination without abnormal findings: Secondary | ICD-10-CM

## 2014-12-29 LAB — LIPID PANEL
CHOL/HDL RATIO: 4.3 ratio (ref <=5.0)
CHOLESTEROL: 212 mg/dL — AB (ref 125–200)
HDL: 49 mg/dL (ref >=46)
LDL Cholesterol: 138 mg/dL — ABNORMAL HIGH (ref <130)
TRIGLYCERIDES: 123 mg/dL (ref <150)
VLDL: 25 mg/dL (ref <30)

## 2014-12-29 LAB — COMPLETE METABOLIC PANEL WITH GFR
ALT: 73 U/L — ABNORMAL HIGH (ref 6–29)
AST: 66 U/L — ABNORMAL HIGH (ref 10–35)
Albumin: 4.2 g/dL (ref 3.6–5.1)
Alkaline Phosphatase: 102 U/L (ref 33–130)
BUN: 7 mg/dL (ref 7–25)
CALCIUM: 9.4 mg/dL (ref 8.6–10.4)
CHLORIDE: 100 mmol/L (ref 98–110)
CO2: 25 mmol/L (ref 20–31)
Creat: 0.68 mg/dL (ref 0.50–1.05)
Glucose, Bld: 101 mg/dL — ABNORMAL HIGH (ref 65–99)
POTASSIUM: 4.3 mmol/L (ref 3.5–5.3)
Sodium: 135 mmol/L (ref 135–146)
Total Bilirubin: 0.6 mg/dL (ref 0.2–1.2)
Total Protein: 7.8 g/dL (ref 6.1–8.1)

## 2014-12-29 MED ORDER — AMLODIPINE BESYLATE 10 MG PO TABS: 10.0000 mg | ORAL_TABLET | Freq: Every day | ORAL | Status: DC

## 2014-12-29 NOTE — Progress Notes (Signed)
Patient ID: Michele Meza, female   DOB: 1961/03/30, 54 y.o.   MRN: [id number]   Michele Meza, is a 54 y.o. female  [id number]  [id number]  DOB - 1961/02/05  CC:  Chief Complaint  Patient presents with  . Hypertension       HPI: Michele Meza is a 55 y.o. female here for follow-up on BP. She is on amlodipine 5 mg daily and reports taking it regularly and reports taking today.  We also need to address some health maintenance issues. She is in need of a mammogram, PAP smear, colon cancer screening, HIV screening. She has been screened for Hep C. She also needs a flu shot today. Information is obtained from the patient with the help of an interpreter. She reports no specific complaints today. She reports smoking 1-2 cigarettes a day, denies alcohol or drug [place].   No Known Allergies No past medical history on file. No current outpatient prescriptions on file prior to visit.   No current facility-administered medications on file prior to visit.   No family history on file. Social History   Social History  . Marital Status: Single    Spouse Name: N/A  . Number of Children: N/A  . Years of Education: N/A   Occupational History  . Not on file.   Social History Main Topics  . Smoking status: Never Smoker   . Smokeless tobacco: Current User    Types: Chew  . Alcohol Use: Not on file  . Drug Use: Not on file  . Sexual Activity: Not on file   Other Topics Concern  . Not on file   Social History Narrative    Review of Systems: Constitutional: Negative for fever, chills, appetite change, weight loss,  fatigue. HENT: Negative for ear pain, ear discharge.nose bleeds Eyes: Negative for pain, discharge, redness, itching and visual disturbance.Wears glasses. Neck: Negative for pain, stiffness Respiratory: Negative for cough, shortness of breath,   Cardiovascular: Negative for chest pain, palpitations and leg swelling. Gastrointestinal: Negative for abdominal distention, abdominal  pain, nausea, vomiting, diarrhea, constipations Genitourinary: Negative for dysuria, urgency, frequency, hematuria, flank pain,  Musculoskeletal: Negative for back pain, joint pain, joint  swelling, arthralgia and gait problem.Negative for weakness. Neurological: Negative for dizziness, tremors, seizures, syncope,   light-headedness, numbness and headaches.  Hematological: Negative for easy bruising or bleeding Psychiatric/Behavioral: Negative for depression, anxiety, decreased concentration, confusion    Objective:   Filed Vitals:   12/29/14 0839  BP: 152/81  Pulse: 85  Temp: 97.5 F (36.4 C)  Resp: 16    Physical Exam: Constitutional: Patient appears well-developed and well-nourished. No distress. HENT: Normocephalic, atraumatic, External right and left ear normal. Oropharynx is clear and moist.  Eyes: Conjunctivae and EOM are normal. PERRLA, no scleral icterus. Neck: Normal ROM. Neck supple. No lymphadenopathy, No thyromegaly. CVS: RRR, S1/S2 +, no murmurs, no gallops, no rubs Pulmonary: Effort and breath sounds normal, no stridor, rhonchi, wheezes, rales.  Abdominal: Soft. Normoactive BS,, no distension, tenderness, rebound or guarding.  Musculoskeletal: Normal range of motion. No edema and no tenderness.  Neuro: Alert.Normal muscle tone coordination. Non-focal Skin: Skin is warm and dry. No rash noted. Not diaphoretic. No erythema. No pallor. Psychiatric: Normal mood and affect. Behavior, judgment, thought content normal.  Lab Results  Component Value Date   WBC 6.5 07/26/2013   HGB 15.3* 07/26/2013   HCT 44.4 07/26/2013   MCV 92.9 07/26/2013   PLT 295 07/26/2013   Lab Results  Component Value Date  CREATININE 0.77 09/15/2014   BUN 11 09/15/2014   NA 136 09/15/2014   K 4.0 09/15/2014   CL 103 09/15/2014   CO2 20 09/15/2014    Lab Results  Component Value Date   HGBA1C 6.3* 09/15/2014   Lipid Panel     Component Value Date/Time   CHOL 200 10/20/2014 1036    TRIG 110 10/20/2014 1036   HDL 47 10/20/2014 1036   CHOLHDL 4.3 10/20/2014 1036   VLDL 22 10/20/2014 1036   LDLCALC 131* 10/20/2014 1036       Assessment and plan:   1. Essential hypertension  - COMPLETE METABOLIC PANEL WITH GFR - Lipid panel -Increase amlodipine to 10 mg daily.  2. Health maintenance -Influenza vaccine today -HIV -Return for PAP in one month -Referral for mammogram. -Advised to give up those last 1-2 cigarettes.  Return in about 4 weeks (around 01/26/2015) for HTN. and PAP smear.   The patient was given clear instructions to go to ER or return to medical center if symptoms don't improve, worsen or new problems develop. The patient verbalized understanding.      Michele Hoover, MSN, FNP-BC   12/29/2014, 9:08 AM

## 2014-12-29 NOTE — Patient Instructions (Addendum)
1. Take two of your amlodipine 5 mg until they are gone. Your next prescription will be for 10 mg one a day. Will send to [facility] 2. Make an appointment in one month for PAP smear and BP check. 3. Call  [phone]    To make an appointment for a mammogram/ 4. You are getting a flu shot today. 5. Need to collect stool specimens to check for colon cancer.

## 2015-01-01 ENCOUNTER — Other Ambulatory Visit: Payer: Self-pay | Admitting: Family Medicine

## 2015-01-01 MED ORDER — ATORVASTATIN CALCIUM 20 MG PO TABS: 20.0000 mg | ORAL_TABLET | Freq: Every day | ORAL | Status: DC

## 2015-01-05 ENCOUNTER — Telehealth: Payer: Self-pay

## 2015-01-05 NOTE — Telephone Encounter (Signed)
Called patient through [employer] using interpreter ID [phone], patient did not answer, voicemail was left advising of high cholesterol and to take medication that has been sent in as prescribed. Asked patient to return call if any questions. Thanks!

## 2015-01-05 NOTE — Telephone Encounter (Signed)
-----   Message from [name], NP sent at 01/01/2015  3:18 PM EDT ----- No significant change in chemistory from 3 months ago. Cholesterol a little higher. Will send in prescription for medication for high cholesterol

## 2015-02-02 ENCOUNTER — Ambulatory Visit (INDEPENDENT_AMBULATORY_CARE_PROVIDER_SITE_OTHER): Payer: No Typology Code available for payment source | Admitting: Family Medicine

## 2015-02-02 ENCOUNTER — Other Ambulatory Visit (HOSPITAL_COMMUNITY)
Admission: RE | Admit: 2015-02-02 | Discharge: 2015-02-02 | Disposition: A | Discharge status: Home / Self Care | Payer: No Typology Code available for payment source | Source: Ambulatory Visit | Attending: Family Medicine | Admitting: Family Medicine

## 2015-02-02 VITALS — BP 165/87 | HR 81 | Temp 98.0°F | Resp 16 | Ht 62.0 in | Wt 155.0 lb

## 2015-02-02 DIAGNOSIS — Z0000 Encounter for general adult medical examination without abnormal findings: Secondary | ICD-10-CM

## 2015-02-02 DIAGNOSIS — Z01411 Encounter for gynecological examination (general) (routine) with abnormal findings: Secondary | ICD-10-CM | POA: Insufficient documentation

## 2015-02-02 DIAGNOSIS — Z1151 Encounter for screening for human papillomavirus (HPV): Secondary | ICD-10-CM | POA: Insufficient documentation

## 2015-02-02 NOTE — Progress Notes (Signed)
Patient ID: Michele Meza, female   DOB: 08/11/1960, 54 y.o.   MRN: [id number]   Michele Meza, is a 54 y.o. female  [id number]  [id number]  DOB - 1961/04/03  CC:  Chief Complaint  Patient presents with  . Gynecologic Exam  . Hypertension       HPI: 54 year old patient presents today for follow-up on hypertension to to have a PAP smear.  I find that she has not had her bp med today, as she apparently did not realized she had refills. We have put in orders for a mammogram but I do not see that she has had that done. There is a language barrier to care and she is assisted by an interpreter. Her BP is elevated today. She denies anyother change in her condition. No Known Allergies No past medical history on file. Current Outpatient Prescriptions on File Prior to Visit  Medication Sig Dispense Refill  . amLODipine (NORVASC) 10 MG tablet Take 1 tablet (10 mg total) by mouth daily. 90 tablet 3  . atorvastatin (LIPITOR) 20 MG tablet Take 1 tablet (20 mg total) by mouth daily. 90 tablet 3   No current facility-administered medications on file prior to visit.   No family history on file. Social History   Social History  . Marital Status: Single    Spouse Name: N/A  . Number of Children: N/A  . Years of Education: N/A   Occupational History  . Not on file.   Social History Main Topics  . Smoking status: Never Smoker   . Smokeless tobacco: Current User    Types: Chew  . Alcohol Use: Not on file  . Drug Use: Not on file  . Sexual Activity: Not on file   Other Topics Concern  . Not on file   Social History Narrative    Review of Systems: Constitutional: Negative  HENT: Negative  Eyes: Negative  Neck: Negative  Respiratory: Negative  Cardiovascular: Negative  Gastrointestinal: Negative  Genitourinary: Negative  Musculoskeletal: Negative Neurological: Negative  numbness  Hematological: Negative  Psychiatric/Behavioral: Negative     Objective:   Filed Vitals:   02/02/15 0826  BP: 165/87  Pulse: 81  Temp: 98 F (36.7 C)  Resp: 16    Physical Exam:  Physical exam deferred except for BP check and PAP smear.  The cervix is friable with mild bleeding with collection of the specimen. There is no cervical motion tenderness or adnexal tenderness.   Lab Results  Component Value Date   WBC 6.5 07/26/2013   HGB 15.3* 07/26/2013   HCT 44.4 07/26/2013   MCV 92.9 07/26/2013   PLT 295 07/26/2013   Lab Results  Component Value Date   CREATININE 0.68 12/29/2014   BUN 7 12/29/2014   NA 135 12/29/2014   K 4.3 12/29/2014   CL 100 12/29/2014   CO2 25 12/29/2014    Lab Results  Component Value Date   HGBA1C 6.3* 09/15/2014   Lipid Panel     Component Value Date/Time   CHOL 212* 12/29/2014 0907   TRIG 123 12/29/2014 0907   HDL 49 12/29/2014 0907   CHOLHDL 4.3 12/29/2014 0907   VLDL 25 12/29/2014 0907   LDLCALC 138* 12/29/2014 0907       Assessment and plan:   1.  Hypertension, uncontrolled -    Refill and get back on amlodipine 10 q day -    Return for BP check with nurse.  2. Health maintenance -   PAP smear  performed today -   Stool card dispensed to be returned for colon cancer screening with she returns for nurse visit. Return in about 2 weeks (around 02/16/2015).  The patient was given clear instructions to go to ER or return to medical center if symptoms don't improve, worsen or new problems develop. The patient verbalized understanding.      Michele HooverLinda C. Bernhardt, MSN, FNP-BC   02/02/2015, 8:59 AM

## 2015-02-02 NOTE — Patient Instructions (Signed)
Refill your blood pressure medication and medication for cholesterol Take these regularly and come back in two weeks for a nurse visit for a BP check. Call and make an appointment for mammogram  [phone] Collect stool specimens and bring when you come in two weeks. Can wait and collect a few days before your appointment

## 2015-02-03 LAB — CYTOLOGY - PAP

## 2015-02-23 ENCOUNTER — Other Ambulatory Visit: Payer: Self-pay | Admitting: Family Medicine

## 2015-02-23 ENCOUNTER — Other Ambulatory Visit (INDEPENDENT_AMBULATORY_CARE_PROVIDER_SITE_OTHER): Payer: No Typology Code available for payment source

## 2015-02-23 DIAGNOSIS — Z0000 Encounter for general adult medical examination without abnormal findings: Secondary | ICD-10-CM

## 2015-02-23 LAB — POC HEMOCCULT BLD/STL (HOME/3-CARD/SCREEN)
Card #2 Fecal Occult Blod, POC: NEGATIVE
FECAL OCCULT BLD: NEGATIVE
FECAL OCCULT BLD: NEGATIVE

## 2015-02-23 MED ORDER — HYDROCHLOROTHIAZIDE 25 MG PO TABS: ORAL_TABLET | ORAL | Status: DC

## 2015-04-24 MED FILL — HYDROCHLOROTHIAZIDE 25 MG T: 25 | 30 days supply | Qty: 30 | Fill #2

## 2015-04-24 MED FILL — AMLODIPINE BESYLATE 10 MG T: 10 | 30 days supply | Qty: 30 | Fill #4

## 2015-04-24 MED FILL — ?ATORVASTATIN 20 MG TABLET: 20 | 30 days supply | Qty: 30 | Fill #4

## 2015-06-01 MED FILL — ATORVASTATIN 20 MG TABLET: 20 | 90 days supply | Qty: 90 | Fill #5

## 2015-06-01 MED FILL — AMLODIPINE BESYLATE 10 MG T: 10 | 90 days supply | Qty: 90 | Fill #5

## 2015-06-01 MED FILL — HYDROCHLOROTHIAZIDE 25 MG T: 25 | 90 days supply | Qty: 90 | Fill #3

## 2015-09-14 MED FILL — ?ATORVASTATIN 20 MG TABLET: 20 | 30 days supply | Qty: 30 | Fill #6

## 2015-09-14 MED FILL — ?AMLODIPINE BESYLATE 10 MG: 10 | 30 days supply | Qty: 30 | Fill #6

## 2015-09-14 MED FILL — ?HYDROCHLOROTHIAZIDE 25 MG: 25 MG | 30 days supply | Qty: 30 | Fill #4

## 2015-11-02 MED FILL — HYDROCHLOROTHIAZIDE 25 MG T: 25 | 30 days supply | Qty: 30 | Fill #5

## 2015-11-02 MED FILL — ?AMLODIPINE BESYLATE 10 MG: 10 | 30 days supply | Qty: 30 | Fill #7

## 2015-11-02 MED FILL — ATORVASTATIN 20 MG TABLET: 20 | 30 days supply | Qty: 30 | Fill #7

## 2015-12-28 MED FILL — HYDROCHLOROTHIAZIDE 25 MG T: 25 | 30 days supply | Qty: 30 | Fill #6

## 2015-12-28 MED FILL — ATORVASTATIN 20 MG TABLET: 20 | 30 days supply | Qty: 30 | Fill #8

## 2015-12-28 MED FILL — AMLODIPINE BESYLATE 10 MG T: 10 | 30 days supply | Qty: 30 | Fill #8

## 2016-03-17 ENCOUNTER — Ambulatory Visit: Payer: Self-pay | Attending: Internal Medicine

## 2016-10-13 ENCOUNTER — Ambulatory Visit: Payer: Self-pay | Attending: Internal Medicine

## 2017-01-13 ENCOUNTER — Ambulatory Visit (HOSPITAL_COMMUNITY)
Admission: EM | Admit: 2017-01-13 | Discharge: 2017-01-13 | Disposition: A | Discharge status: Home / Self Care | Payer: Self-pay | Attending: Physician Assistant | Admitting: Physician Assistant

## 2017-01-13 ENCOUNTER — Encounter (HOSPITAL_COMMUNITY): Payer: Self-pay | Admitting: *Deleted

## 2017-01-13 DIAGNOSIS — L239 Allergic contact dermatitis, unspecified cause: Secondary | ICD-10-CM

## 2017-01-13 DIAGNOSIS — I10 Essential (primary) hypertension: Secondary | ICD-10-CM

## 2017-01-13 DIAGNOSIS — R21 Rash and other nonspecific skin eruption: Secondary | ICD-10-CM

## 2017-01-13 HISTORY — DX: Essential (primary) hypertension: I10

## 2017-01-13 MED ORDER — PREDNISONE 10 MG (48) PO TBPK: ORAL_TABLET | ORAL | 0 refills | Status: DC

## 2017-01-13 MED ORDER — TRIAMCINOLONE ACETONIDE 0.1 % EX CREA
1.0000 | TOPICAL_CREAM | Freq: Two times a day (BID) | CUTANEOUS | 0 refills | Status: DC
Start: 2017-01-13 — End: 2018-06-21

## 2017-01-13 NOTE — Discharge Instructions (Signed)
Looks like you had an allergic reaction to something outside maybe poison oak. The prednisone pack will help you, may use the cream as well lightly on the skin twice a day. Also taking Benadryl  every 4-6 hours as needed for itching can be helpful. Hope you feel better.

## 2017-01-13 NOTE — ED Provider Notes (Signed)
[facility]    CSN: [id number] Arrival date & time: 01/13/17  1713     History   Chief Complaint Chief Complaint  Patient presents with  . Rash    Michele Meza is a 56 y.o. female.   Presents today with rash x 2 weeks. Patient's daughter is Programmer, applications and reports she has been walking in the woods and could have gotten into something. She has been using OTC creams without much relief. It is very pruritic. No pain and no fevers. No dyspnea.      Past Medical History:  Diagnosis Date  . Hypertension     Patient Active Problem List   Diagnosis Date Noted  . Essential hypertension 09/15/2014  . Prediabetes 09/15/2014    History reviewed. No pertinent surgical history.  OB History    No data available       Home Medications    Prior to Admission medications   Medication Sig Start Date End Date Taking? Authorizing Provider  amLODipine (NORVASC) 10 MG tablet Take 1 tablet (10 mg total) by mouth daily. 12/29/14  Yes [name], NP  atorvastatin (LIPITOR) 20 MG tablet Take 1 tablet (20 mg total) by mouth daily. 01/01/15  Yes [name], NP  hydrochlorothiazide (HYDRODIURIL) 25 MG tablet Take one tablet daily. 02/23/15  Yes [name], NP  predniSONE (STERAPRED UNI-PAK 48 TAB) 10 MG (48) TBPK tablet Take daily as directed for rash 01/13/17   Riki Sheer, PA-C  triamcinolone cream (KENALOG) 0.1 % Apply 1 application topically 2 (two) times daily. 01/13/17   Riki Sheer, PA-C    Family History History reviewed. No pertinent family history.  Social History Social History  Substance Use Topics  . Smoking status: Never Smoker  . Smokeless tobacco: Current User    Types: Chew  . Alcohol use No     Allergies   Patient has no known allergies.   Review of Systems Review of Systems  Constitutional: Negative for fever.  Skin: Positive for rash.     Physical Exam Triage Vital Signs ED Triage Vitals [01/13/17  1736]  Enc Vitals Group     BP (!) 169/92     Pulse Rate 78     Resp 17     Temp 98 F (36.7 C)     Temp Source Oral     SpO2 100 %     Weight      Height      Head Circumference      Peak Flow      Pain Score      Pain Loc      Pain Edu?      Excl. in GC?    No data found.   Updated Vital Signs BP (!) 169/92 (BP Location: Left Arm)   Pulse 78   Temp 98 F (36.7 C) (Oral)   Resp 17   LMP 05/10/2011   SpO2 100%   Visual Acuity Right Eye Distance:   Left Eye Distance:   Bilateral Distance:    Right Eye Near:   Left Eye Near:    Bilateral Near:     Physical Exam  Constitutional: She is oriented to person, place, and time. She appears well-developed and well-nourished. No distress.  Pulmonary/Chest: Breath sounds normal.  Neurological: She is alert and oriented to person, place, and time.  Skin: Skin is warm and dry. Rash noted. She is not diaphoretic.  Thick macular papular rash to extremities  and chest with mild excoriations.   Psychiatric: Her behavior is normal.  Nursing note and vitals reviewed.    UC Treatments / Results  Labs (all labs ordered are listed, but only abnormal results are displayed) Labs Reviewed - No data to display  EKG  EKG Interpretation None       Radiology No results found.  Procedures Procedures (including critical care time)  Medications Ordered in UC Medications - No data to display   Initial Impression / Assessment and Plan / UC Course  I have reviewed the triage vital signs and the nursing notes.  Pertinent labs & imaging results that were available during my care of the patient were reviewed by me and considered in my medical decision making (see chart for details).     Contact dermatitis-Given RX for prednisone given severity. Steroid cream and OTC antihistamines. FU as needed.  Verbally told patient to f/u with PCP for HTN.  Final Clinical Impressions(s) / UC Diagnoses   Final diagnoses:  Allergic  contact dermatitis, unspecified trigger  Rash    New Prescriptions New Prescriptions   PREDNISONE (STERAPRED UNI-PAK 48 TAB) 10 MG (48) TBPK TABLET    Take daily as directed for rash   TRIAMCINOLONE CREAM (KENALOG) 0.1 %    Apply 1 application topically 2 (two) times daily.     Controlled Substance Prescriptions [place] Controlled Substance Registry consulted? Not Applicable   Riki Sheer, PA-C 01/13/17 1610

## 2017-01-13 NOTE — ED Triage Notes (Signed)
Patient reports 2 week history of itching to arms and legs. Patient reports using new soap for body.

## 2018-06-21 ENCOUNTER — Other Ambulatory Visit: Payer: Self-pay

## 2018-06-21 ENCOUNTER — Encounter (HOSPITAL_COMMUNITY): Payer: Self-pay | Admitting: Family Medicine

## 2018-06-21 ENCOUNTER — Ambulatory Visit (HOSPITAL_COMMUNITY)
Admission: EM | Admit: 2018-06-21 | Discharge: 2018-06-21 | Disposition: A | Discharge status: Home / Self Care | Payer: Self-pay | Attending: Family Medicine | Admitting: Family Medicine

## 2018-06-21 DIAGNOSIS — T7840XA Allergy, unspecified, initial encounter: Secondary | ICD-10-CM

## 2018-06-21 MED ORDER — DIPHENHYDRAMINE HCL 25 MG PO CAPS
25.0000 mg | ORAL_CAPSULE | Freq: Once | ORAL | Status: AC
  Administered 2018-06-21: 25 mg via ORAL

## 2018-06-21 MED ORDER — DIPHENHYDRAMINE HCL 25 MG PO CAPS
ORAL_CAPSULE | ORAL | Status: AC
  Filled 2018-06-21: qty 1

## 2018-06-21 MED ORDER — PREDNISONE 10 MG (21) PO TBPK: ORAL_TABLET | ORAL | 0 refills | Status: DC

## 2018-06-21 MED ORDER — METHYLPREDNISOLONE SODIUM SUCC 125 MG IJ SOLR
INTRAMUSCULAR | Status: AC
  Filled 2018-06-21: qty 2

## 2018-06-21 MED ORDER — METHYLPREDNISOLONE SODIUM SUCC 125 MG IJ SOLR
80.0000 mg | Freq: Once | INTRAMUSCULAR | Status: AC
  Administered 2018-06-21: 80 mg via INTRAMUSCULAR

## 2018-06-21 MED ORDER — DIPHENHYDRAMINE HCL 25 MG PO TABS: 25.0000 mg | ORAL_TABLET | Freq: Four times a day (QID) | ORAL | 0 refills | Status: DC

## 2018-06-21 NOTE — ED Provider Notes (Signed)
[facility]    CSN: [id number] Arrival date & time: 06/21/18  6503     History   Chief Complaint Chief Complaint  Patient presents with  . Allergic Reaction    Hives    HPI Michele Meza is a 58 y.o. female.   Patient is a 58 year old female that presents with allergic reaction.  This started last night after eating a banana. She has never had a reaction to bananas in the past. She woke up this morning with widespread urticaria and pruritus.  Symptoms have been constant worsening.  She has not taken anything for the rash or itching.  She denies any trouble swallowing, breathing.   Denies any fever, joint pain. Denies any recent changes in lotions, detergents, foods or other possible irritants. No recent travel. Nobody else at home has the rash. Patient has been outside but denies any contact with plants or insects. No new foods or medications.   ROS per HPI    Allergic Reaction    Past Medical History:  Diagnosis Date  . Hypertension     Patient Active Problem List   Diagnosis Date Noted  . Essential hypertension 09/15/2014  . Prediabetes 09/15/2014    History reviewed. No pertinent surgical history.  OB History   No obstetric history on file.      Home Medications    Prior to Admission medications   Medication Sig Start Date End Date Taking? Authorizing Provider  amLODipine (NORVASC) 10 MG tablet Take 1 tablet (10 mg total) by mouth daily. 12/29/14   [name], NP  atorvastatin (LIPITOR) 20 MG tablet Take 1 tablet (20 mg total) by mouth daily. 01/01/15   [name], NP  diphenhydrAMINE (BENADRYL) 25 MG tablet Take 1 tablet (25 mg total) by mouth every 6 (six) hours. 06/21/18   Dahlia Byes A, NP  hydrochlorothiazide (HYDRODIURIL) 25 MG tablet Take one tablet daily. 02/23/15   [name], NP  predniSONE (STERAPRED UNI-PAK 21 TAB) 10 MG (21) TBPK tablet 6 tabs for 1 day, then 5 tabs for 1 das, then 4 tabs for 1 day, then 3 tabs for  1 day, 2 tabs for 1 day, then 1 tab for 1 day 06/21/18   Janace Aris, NP    Family History History reviewed. No pertinent family history.  Social History Social History   Tobacco Use  . Smoking status: Never Smoker  . Smokeless tobacco: Current User    Types: Chew  Substance Use Topics  . Alcohol use: No  . Drug use: Not on file     Allergies   Patient has no known allergies.   Review of Systems Review of Systems   Physical Exam Triage Vital Signs ED Triage Vitals [06/21/18 1041]  Enc Vitals Group     BP 136/67     Pulse Rate 85     Resp 16     Temp 97.9 F (36.6 C)     Temp Source Oral     SpO2 97 %     Weight      Height      Head Circumference      Peak Flow      Pain Score 0     Pain Loc      Pain Edu?      Excl. in GC?    No data found.  Updated Vital Signs BP 136/67 (BP Location: Right Arm)   Pulse 85   Temp 97.9 F (36.6 C) (  Oral)   Resp 16   LMP 05/10/2011   SpO2 97%   Visual Acuity Right Eye Distance:   Left Eye Distance:   Bilateral Distance:    Right Eye Near:   Left Eye Near:    Bilateral Near:     Physical Exam Vitals signs and nursing note reviewed.  Constitutional:      General: She is not in acute distress.    Appearance: She is not ill-appearing, toxic-appearing or diaphoretic.  HENT:     Head: Normocephalic and atraumatic.     Nose: Nose normal.     Mouth/Throat:     Pharynx: Oropharynx is clear.  Eyes:     Conjunctiva/sclera: Conjunctivae normal.  Neck:     Musculoskeletal: Normal range of motion.  Cardiovascular:     Rate and Rhythm: Normal rate and regular rhythm.     Pulses: Normal pulses.     Heart sounds: Normal heart sounds.  Pulmonary:     Effort: Pulmonary effort is normal.     Breath sounds: Normal breath sounds.  Musculoskeletal: Normal range of motion.  Skin:    General: Skin is warm and dry.     Findings: Rash present.     Comments: Widespread urticaria  Neurological:     Mental Status: She  is alert.  Psychiatric:        Mood and Affect: Mood normal.      UC Treatments / Results  Labs (all labs ordered are listed, but only abnormal results are displayed) Labs Reviewed - No data to display  EKG None  Radiology No results found.  Procedures Procedures (including critical care time)  Medications Ordered in UC Medications  methylPREDNISolone sodium succinate (SOLU-MEDROL) 125 mg/2 mL injection 80 mg (has no administration in time range)  diphenhydrAMINE (BENADRYL) capsule 25 mg (has no administration in time range)    Initial Impression / Assessment and Plan / UC Course  I have reviewed the triage vital signs and the nursing notes.  Pertinent labs & imaging results that were available during my care of the patient were reviewed by me and considered in my medical decision making (see chart for details).    Allergic reaction.   Steroid injection given in clinic for allergic reaction and widespread urticaria.  Benadryl given for itching Will send home with prescription for prednisone taper and Benadryl Strict precautions that if her symptoms worsen or she starts developing trouble breathing or swallowing she needs to go the ER Final Clinical Impressions(s) / UC Diagnoses   Final diagnoses:  Allergic reaction, initial encounter     Discharge Instructions     It appears you having an allergic reaction to unknown substance Steroid injection given here in clinic along with Benadryl for itching Prescription sent to the pharmacy If you start developing any trouble swallowing, breathing please go to the ER    ED Prescriptions    Medication Sig Dispense Auth. Provider   predniSONE (STERAPRED UNI-PAK 21 TAB) 10 MG (21) TBPK tablet 6 tabs for 1 day, then 5 tabs for 1 das, then 4 tabs for 1 day, then 3 tabs for 1 day, 2 tabs for 1 day, then 1 tab for 1 day 21 tablet Kaelah Hayashi A, NP   diphenhydrAMINE (BENADRYL) 25 MG tablet Take 1 tablet (25 mg total) by mouth  every 6 (six) hours. 20 tablet Dahlia ByesBast, Christia Coaxum A, NP     Controlled Substance Prescriptions [place] Controlled Substance Registry consulted? Not Applicable   Dahlia ByesBast, Myer Bohlman A,  NP 06/21/18 1052

## 2018-06-21 NOTE — Discharge Instructions (Signed)
It appears you having an allergic reaction to unknown substance Steroid injection given here in clinic along with Benadryl for itching Prescription sent to the pharmacy If you start developing any trouble swallowing, breathing please go to the ER

## 2018-06-21 NOTE — ED Triage Notes (Signed)
Pt presents with an allergic reaction to unknown source; Pt has hives in different areas of body.

## 2018-11-14 ENCOUNTER — Other Ambulatory Visit: Payer: Self-pay

## 2018-11-14 DIAGNOSIS — Z20822 Contact with and (suspected) exposure to covid-19: Secondary | ICD-10-CM

## 2018-11-16 LAB — NOVEL CORONAVIRUS, NAA: SARS-CoV-2, NAA: NOT DETECTED

## 2019-02-04 ENCOUNTER — Other Ambulatory Visit: Payer: Self-pay | Admitting: *Deleted

## 2019-02-04 DIAGNOSIS — Z20822 Contact with and (suspected) exposure to covid-19: Secondary | ICD-10-CM

## 2019-02-05 LAB — NOVEL CORONAVIRUS, NAA: SARS-CoV-2, NAA: DETECTED — AB

## 2020-02-15 ENCOUNTER — Encounter (HOSPITAL_COMMUNITY): Payer: Self-pay

## 2020-02-15 ENCOUNTER — Other Ambulatory Visit: Payer: Self-pay

## 2020-02-15 ENCOUNTER — Ambulatory Visit (HOSPITAL_COMMUNITY)
Admission: EM | Admit: 2020-02-15 | Discharge: 2020-02-15 | Disposition: A | Discharge status: Home / Self Care | Payer: Self-pay | Attending: Family Medicine | Admitting: Family Medicine

## 2020-02-15 DIAGNOSIS — R739 Hyperglycemia, unspecified: Secondary | ICD-10-CM | POA: Insufficient documentation

## 2020-02-15 DIAGNOSIS — I10 Essential (primary) hypertension: Secondary | ICD-10-CM | POA: Insufficient documentation

## 2020-02-15 LAB — COMPREHENSIVE METABOLIC PANEL
ALT: 40 U/L (ref 0–44)
AST: 116 U/L — ABNORMAL HIGH (ref 15–41)
Albumin: 2.9 g/dL — ABNORMAL LOW (ref 3.5–5.0)
Alkaline Phosphatase: 127 U/L — ABNORMAL HIGH (ref 38–126)
Anion gap: 10 (ref 5–15)
BUN: <5 mg/dL — ABNORMAL LOW (ref 6–20)
CO2: 24 mmol/L (ref 22–32)
Calcium: 8.6 mg/dL — ABNORMAL LOW (ref 8.9–10.3)
Chloride: 101 mmol/L (ref 98–111)
Creatinine, Ser: 0.75 mg/dL (ref 0.44–1.00)
GFR, Estimated: >60 mL/min (ref >60)
Glucose, Bld: 145 mg/dL — ABNORMAL HIGH (ref 70–99)
Potassium: 3.7 mmol/L (ref 3.5–5.1)
Sodium: 135 mmol/L (ref 135–145)
Total Bilirubin: 1.8 mg/dL — ABNORMAL HIGH (ref 0.3–1.2)
Total Protein: 8.4 g/dL — ABNORMAL HIGH (ref 6.5–8.1)

## 2020-02-15 MED ORDER — LOSARTAN POTASSIUM 50 MG PO TABS: 50.0000 mg | ORAL_TABLET | Freq: Every day | ORAL | 1 refills | Status: DC

## 2020-02-15 NOTE — ED Triage Notes (Signed)
Pt presents with dizziness, weakness, elevated blood pressure and blood sugar since yesterday.

## 2020-02-15 NOTE — Discharge Instructions (Signed)
Follow up with your Primary Care Doctor as soon as you can for recheck on blood pressure and blood sugars

## 2020-02-15 NOTE — ED Provider Notes (Signed)
[facility]    CSN: [id number] Arrival date & time: 02/15/20  1004      History   Chief Complaint Chief Complaint  Patient presents with  . Dizziness  . Hypertension  . Elevated Blood Sugar    HPI Michele Meza is a 59 y.o. female.   Medical interpreter was used today to facilitate visit with patient consent. She presents today with posterior headache, concern over recent elevated blood pressures and blood sugars. Does not remember specific numbers and has not written them down. Denies CP, SOB, palpitations, polyuria, polyphagia, polydipsia, syncope, weakness, fatigue. Has not been to PCP in over a year, states she used to be on several BP medications but they made things worse so she stopped them a year ago. Hx of prediabetes, currently diet controlled but well overdue for labs. Not currently on any medications.      Past Medical History:  Diagnosis Date  . Hypertension     Patient Active Problem List   Diagnosis Date Noted  . Essential hypertension 09/15/2014  . Prediabetes 09/15/2014    History reviewed. No pertinent surgical history.  OB History   No obstetric history on file.      Home Medications    Prior to Admission medications   Medication Sig Start Date End Date Taking? Authorizing Provider  atorvastatin (LIPITOR) 20 MG tablet Take 1 tablet (20 mg total) by mouth daily. 01/01/15   [name], NP  diphenhydrAMINE (BENADRYL) 25 MG tablet Take 1 tablet (25 mg total) by mouth every 6 (six) hours. 06/21/18   Dahlia Byes A, NP  losartan (COZAAR) 50 MG tablet Take 1 tablet (50 mg total) by mouth daily. 02/15/20   Particia Nearing, PA-C  predniSONE (STERAPRED UNI-PAK 21 TAB) 10 MG (21) TBPK tablet 6 tabs for 1 day, then 5 tabs for 1 das, then 4 tabs for 1 day, then 3 tabs for 1 day, 2 tabs for 1 day, then 1 tab for 1 day 06/21/18   Dahlia Byes A, NP  hydrochlorothiazide (HYDRODIURIL) 25 MG tablet Take one tablet daily. 02/23/15 02/15/20   [name], NP    Family History Family History  Family history unknown: Yes    Social History Social History   Tobacco Use  . Smoking status: Never Smoker  . Smokeless tobacco: Current User    Types: Chew  Substance Use Topics  . Alcohol use: No  . Drug use: Not on file     Allergies   Patient has no known allergies.   Review of Systems Review of Systems PER HPI    Physical Exam Triage Vital Signs ED Triage Vitals  Enc Vitals Group     BP 02/15/20 1030 (!) 174/86     Pulse Rate 02/15/20 1030 92     Resp 02/15/20 1030 16     Temp 02/15/20 1030 98 F (36.7 C)     Temp Source 02/15/20 1030 Oral     SpO2 02/15/20 1030 100 %     Weight --      Height --      Head Circumference --      Peak Flow --      Pain Score 02/15/20 1028 1     Pain Loc --      Pain Edu? --      Excl. in GC? --    No data found.  Updated Vital Signs BP (!) 174/86 (BP Location: Left Arm)   Pulse 92  Temp 98 F (36.7 C) (Oral)   Resp 16   LMP 05/10/2011   SpO2 100%   Visual Acuity Right Eye Distance:   Left Eye Distance:   Bilateral Distance:    Right Eye Near:   Left Eye Near:    Bilateral Near:     Physical Exam Vitals and nursing note reviewed.  Constitutional:      Appearance: Normal appearance. She is not ill-appearing.  HENT:     Head: Atraumatic.     Mouth/Throat:     Mouth: Mucous membranes are moist.     Pharynx: Oropharynx is clear.  Eyes:     Extraocular Movements: Extraocular movements intact.     Conjunctiva/sclera: Conjunctivae normal.  Cardiovascular:     Rate and Rhythm: Normal rate and regular rhythm.     Heart sounds: Normal heart sounds.  Pulmonary:     Effort: Pulmonary effort is normal.     Breath sounds: Normal breath sounds.  Musculoskeletal:        General: Normal range of motion.     Cervical back: Normal range of motion and neck supple.  Skin:    General: Skin is warm and dry.  Neurological:     General: No focal deficit  present.     Mental Status: She is alert and oriented to person, place, and time.  Psychiatric:        Mood and Affect: Mood normal.        Thought Content: Thought content normal.        Judgment: Judgment normal.      UC Treatments / Results  Labs (all labs ordered are listed, but only abnormal results are displayed) Labs Reviewed  COMPREHENSIVE METABOLIC PANEL    EKG   Radiology No results found.  Procedures Procedures (including critical care time)  Medications Ordered in UC Medications - No data to display  Initial Impression / Assessment and Plan / UC Course  I have reviewed the triage vital signs and the nursing notes.  Pertinent labs & imaging results that were available during my care of the patient were reviewed by me and considered in my medical decision making (see chart for details).     HTN - Declines restarting past medications (amlodipine, HCTZ listed in historical meds). Will trial losartan and await CMP results for safety. Discussed DASH diet, exercise, close home monitoring and logging and PCP f/u in the next few weeks.   Prediabetes - will obtain CMP today, discussed lifestyle changes and close PCP f/u for further monitoring and mgmt. Await results, if significantly elevated may trial some medication prior to PCP f/u.   Final Clinical Impressions(s) / UC Diagnoses   Final diagnoses:  Essential hypertension  Elevated blood sugar     Discharge Instructions     Follow up with your Primary Care Doctor as soon as you can for recheck on blood pressure and blood sugars    ED Prescriptions    Medication Sig Dispense Auth. Provider   losartan (COZAAR) 50 MG tablet Take 1 tablet (50 mg total) by mouth daily. 30 tablet Particia Nearing, [place]     PDMP not reviewed this encounter.   Particia Nearing, [place] 02/15/20 1202

## 2020-03-02 ENCOUNTER — Encounter (HOSPITAL_COMMUNITY): Payer: Self-pay

## 2020-03-02 ENCOUNTER — Ambulatory Visit (HOSPITAL_COMMUNITY)
Admission: EM | Admit: 2020-03-02 | Discharge: 2020-03-02 | Disposition: A | Discharge status: Home / Self Care | Payer: Self-pay | Attending: Family Medicine | Admitting: Family Medicine

## 2020-03-02 ENCOUNTER — Other Ambulatory Visit: Payer: Self-pay

## 2020-03-02 DIAGNOSIS — R42 Dizziness and giddiness: Secondary | ICD-10-CM

## 2020-03-02 MED ORDER — ONDANSETRON 4 MG PO TBDP: 4.0000 mg | ORAL_TABLET | Freq: Three times a day (TID) | ORAL | 0 refills | Status: DC | PRN

## 2020-03-02 MED ORDER — ONDANSETRON 4 MG PO TBDP
ORAL_TABLET | ORAL | Status: AC
  Filled 2020-03-02: qty 1

## 2020-03-02 MED ORDER — MECLIZINE HCL 25 MG PO TABS: 25.0000 mg | ORAL_TABLET | Freq: Three times a day (TID) | ORAL | 0 refills | Status: DC | PRN

## 2020-03-02 MED ORDER — ONDANSETRON 4 MG PO TBDP
4.0000 mg | ORAL_TABLET | Freq: Once | ORAL | Status: AC
  Administered 2020-03-02: 4 mg via ORAL

## 2020-03-02 NOTE — ED Triage Notes (Signed)
Pt presents with chronic dizziness but current episode started yesterday.

## 2020-03-02 NOTE — ED Provider Notes (Addendum)
[facility]    CSN: [id number] Arrival date & time: 03/02/20  0908      History   Chief Complaint Chief Complaint  Patient presents with  . Dizziness    HPI Michele Meza is a 59 y.o. female.   Patient is a 59 year old female with past medical history of hypertension.  She presents today with dizziness.  This started yesterday.  She has had similar episodes like this in the past with vertigo.  Sensation of the room is spinning around.  She has some associated nausea.  No vomiting.  No headache, blurred vision.  Denies any falls or head injuries.  Denies any recent illness.  Patient taking losartan for blood pressure.  Vital signs normal here today. No chest pain, SOB.   All information obtained using the Publishing copy     Past Medical History:  Diagnosis Date  . Hypertension     Patient Active Problem List   Diagnosis Date Noted  . Essential hypertension 09/15/2014  . Prediabetes 09/15/2014    History reviewed. No pertinent surgical history.  OB History   No obstetric history on file.      Home Medications    Prior to Admission medications   Medication Sig Start Date End Date Taking? Authorizing Provider  atorvastatin (LIPITOR) 20 MG tablet Take 1 tablet (20 mg total) by mouth daily. 01/01/15   [name], NP  diphenhydrAMINE (BENADRYL) 25 MG tablet Take 1 tablet (25 mg total) by mouth every 6 (six) hours. 06/21/18   Dahlia Byes A, NP  losartan (COZAAR) 50 MG tablet Take 1 tablet (50 mg total) by mouth daily. 02/15/20   Particia Nearing, PA-C  meclizine (ANTIVERT) 25 MG tablet Take 1 tablet (25 mg total) by mouth 3 (three) times daily as needed for dizziness. 03/02/20   Shakeila Pfarr, Gloris Manchester A, NP  ondansetron (ZOFRAN ODT) 4 MG disintegrating tablet Take 1 tablet (4 mg total) by mouth every 8 (eight) hours as needed for nausea or vomiting. 03/02/20   Dahlia Byes A, NP  hydrochlorothiazide (HYDRODIURIL) 25 MG tablet Take one tablet daily. 02/23/15  02/15/20  [name], NP    Family History Family History  Family history unknown: Yes    Social History Social History   Tobacco Use  . Smoking status: Never Smoker  . Smokeless tobacco: Current User    Types: Chew  Substance Use Topics  . Alcohol use: No  . Drug use: Not on file     Allergies   Patient has no known allergies.   Review of Systems Review of Systems   Physical Exam Triage Vital Signs ED Triage Vitals  Enc Vitals Group     BP 03/02/20 1000 121/73     Pulse Rate 03/02/20 1000 92     Resp 03/02/20 1000 17     Temp 03/02/20 1000 98.4 F (36.9 C)     Temp Source 03/02/20 1000 Oral     SpO2 03/02/20 1000 100 %     Weight --      Height --      Head Circumference --      Peak Flow --      Pain Score 03/02/20 0958 0     Pain Loc --      Pain Edu? --      Excl. in GC? --    No data found.  Updated Vital Signs BP 121/73 (BP Location: Right Arm)   Pulse 92   Temp 98.4  F (36.9 C) (Oral)   Resp 17   LMP 05/10/2011   SpO2 100%   Visual Acuity Right Eye Distance:   Left Eye Distance:   Bilateral Distance:    Right Eye Near:   Left Eye Near:    Bilateral Near:     Physical Exam Vitals and nursing note reviewed.  Constitutional:      General: She is not in acute distress.    Appearance: She is not toxic-appearing or diaphoretic.  HENT:     Head: Normocephalic and atraumatic.     Nose: Nose normal.  Eyes:     Extraocular Movements: Extraocular movements intact.     Conjunctiva/sclera: Conjunctivae normal.     Pupils: Pupils are equal, round, and reactive to light.     Comments: No nystagmus.   Pulmonary:     Effort: Pulmonary effort is normal.  Skin:    General: Skin is warm and dry.  Neurological:     Mental Status: She is alert.     Cranial Nerves: No cranial nerve deficit.     Sensory: No sensory deficit.     Motor: No weakness.     Gait: Gait abnormal.     Comments: Positive romberg  Speech clear using  translator. No unilateral extremity weakness.  Grip strength normal. No facial droop.   Psychiatric:        Mood and Affect: Mood normal.      UC Treatments / Results  Labs (all labs ordered are listed, but only abnormal results are displayed) Labs Reviewed - No data to display  EKG   Radiology No results found.  Procedures Procedures (including critical care time)  Medications Ordered in UC Medications  ondansetron (ZOFRAN-ODT) disintegrating tablet 4 mg (4 mg Oral Given 03/02/20 1048)    Initial Impression / Assessment and Plan / UC Course  I have reviewed the triage vital signs and the nursing notes.  Pertinent labs & imaging results that were available during my care of the patient were reviewed by me and considered in my medical decision making (see chart for details).     Vertigo Most likely diagnosis based on exam and history. No concern for CVA at this time. VSS  Prescribing meclizine to use as needed for dizziness.  Zofran for nausea or vomiting as needed. Recommended rest, drink plenty fluids and to go to the ER for any worsening problems Patient understanding to plan Translator used for entire encounter  Final Clinical Impressions(s) / UC Diagnoses   Final diagnoses:  Vertigo     Discharge Instructions     Take the medication as prescribed for dizziness.  3 times a day as needed.  Zofran as needed for nausea or vomiting. Make sure you are drinking plenty of fluids staying hydrated For any worsening symptoms please go to the ER    ED Prescriptions    Medication Sig Dispense Auth. Provider   meclizine (ANTIVERT) 25 MG tablet Take 1 tablet (25 mg total) by mouth 3 (three) times daily as needed for dizziness. 30 tablet Alexa Blish A, NP   ondansetron (ZOFRAN ODT) 4 MG disintegrating tablet Take 1 tablet (4 mg total) by mouth every 8 (eight) hours as needed for nausea or vomiting. 20 tablet Dahlia Byes A, NP     PDMP not reviewed this encounter.    Janace Aris, NP 03/02/20 1050    Dahlia Byes A, NP 03/02/20 1053

## 2020-03-02 NOTE — Discharge Instructions (Addendum)
Take the medication as prescribed for dizziness.  3 times a day as needed.  Zofran as needed for nausea or vomiting. Make sure you are drinking plenty of fluids staying hydrated For any worsening symptoms please go to the ER

## 2020-04-10 ENCOUNTER — Other Ambulatory Visit: Payer: Self-pay | Admitting: Family Medicine

## 2020-04-10 NOTE — Telephone Encounter (Signed)
   Notes to clinic No PCP listed, however, this rx was from [place], [place]

## 2020-04-15 ENCOUNTER — Other Ambulatory Visit: Payer: Self-pay | Admitting: Family Medicine

## 2020-04-22 ENCOUNTER — Other Ambulatory Visit: Payer: Self-pay

## 2020-04-22 ENCOUNTER — Encounter (HOSPITAL_COMMUNITY): Payer: Self-pay

## 2020-04-22 ENCOUNTER — Ambulatory Visit (HOSPITAL_COMMUNITY)
Admission: EM | Admit: 2020-04-22 | Discharge: 2020-04-22 | Disposition: A | Discharge status: Home / Self Care | Payer: Self-pay | Attending: Urgent Care | Admitting: Urgent Care

## 2020-04-22 ENCOUNTER — Ambulatory Visit (INDEPENDENT_AMBULATORY_CARE_PROVIDER_SITE_OTHER): Payer: Self-pay

## 2020-04-22 DIAGNOSIS — R030 Elevated blood-pressure reading, without diagnosis of hypertension: Secondary | ICD-10-CM

## 2020-04-22 DIAGNOSIS — I10 Essential (primary) hypertension: Secondary | ICD-10-CM

## 2020-04-22 DIAGNOSIS — S8991XA Unspecified injury of right lower leg, initial encounter: Secondary | ICD-10-CM

## 2020-04-22 DIAGNOSIS — M25561 Pain in right knee: Secondary | ICD-10-CM

## 2020-04-22 MED ORDER — LOSARTAN POTASSIUM 50 MG PO TABS: 50.0000 mg | ORAL_TABLET | Freq: Every day | ORAL | 0 refills | Status: DC

## 2020-04-22 MED ORDER — NAPROXEN 375 MG PO TABS: 375.0000 mg | ORAL_TABLET | Freq: Two times a day (BID) | ORAL | 0 refills | Status: DC

## 2020-04-22 NOTE — ED Provider Notes (Signed)
[facility]   MRN: [id number] DOB: Apr 06, 1961  Subjective:   Michele Meza is a 60 y.o. female presenting for 2-day history of persistent right knee pain from hitting herself against the ladder.  The area that hurts is not the area that might impact against her leg but has had significant difficulty bearing weight due to the pain in that area.  Has not taken any medications for relief.  Denies bruising, swelling, warmth, erythema.  Patient also has a history of high blood pressure and is out of her medication Cozaar.  She would like a refill of this.  She does have a follow-up appointment coming up soon with her regular doctor.  Denies headache, confusion, chest pain.  No current facility-administered medications for this encounter.  Current Outpatient Medications:  .  atorvastatin (LIPITOR) 20 MG tablet, Take 1 tablet (20 mg total) by mouth daily., Disp: 90 tablet, Rfl: 3 .  diphenhydrAMINE (BENADRYL) 25 MG tablet, Take 1 tablet (25 mg total) by mouth every 6 (six) hours., Disp: 20 tablet, Rfl: 0 .  losartan (COZAAR) 50 MG tablet, Take 1 tablet (50 mg total) by mouth daily., Disp: 30 tablet, Rfl: 1 .  meclizine (ANTIVERT) 25 MG tablet, Take 1 tablet (25 mg total) by mouth 3 (three) times daily as needed for dizziness., Disp: 30 tablet, Rfl: 0 .  ondansetron (ZOFRAN ODT) 4 MG disintegrating tablet, Take 1 tablet (4 mg total) by mouth every 8 (eight) hours as needed for nausea or vomiting., Disp: 20 tablet, Rfl: 0   No Known Allergies  Past Medical History:  Diagnosis Date  . Hypertension      History reviewed. No pertinent surgical history.  Family History  Family history unknown: Yes    Social History   Tobacco Use  . Smoking status: Never Smoker  . Smokeless tobacco: Current User    Types: Chew  Substance Use Topics  . Alcohol use: No  . Drug use: Never    ROS   Objective:   Vitals: BP (!) 152/87   Pulse 69   Temp (!) 97.4 F (36.3 C)   Resp 18    LMP 05/10/2011   SpO2 100%   Physical Exam Constitutional:      General: She is not in acute distress.    Appearance: Normal appearance. She is well-developed. She is not ill-appearing, toxic-appearing or diaphoretic.  HENT:     Head: Normocephalic and atraumatic.     Nose: Nose normal.     Mouth/Throat:     Mouth: Mucous membranes are moist.     Pharynx: Oropharynx is clear.  Eyes:     General: No scleral icterus.       Right eye: No discharge.        Left eye: No discharge.     Extraocular Movements: Extraocular movements intact.     Conjunctiva/sclera: Conjunctivae normal.     Pupils: Pupils are equal, round, and reactive to light.  Cardiovascular:     Rate and Rhythm: Normal rate.  Pulmonary:     Effort: Pulmonary effort is normal.  Musculoskeletal:     Right knee: Bony tenderness present. No swelling, deformity, effusion, erythema, ecchymosis, lacerations or crepitus. Decreased range of motion. Tenderness present over the medial joint line and patellar tendon. Normal alignment and normal patellar mobility.  Skin:    General: Skin is warm and dry.  Neurological:     General: No focal deficit present.     Mental Status: She  is alert and oriented to person, place, and time.  Psychiatric:        Mood and Affect: Mood normal.        Behavior: Behavior normal.        Thought Content: Thought content normal.        Judgment: Judgment normal.     DG Knee Complete 4 Views Right  Result Date: 04/22/2020 CLINICAL DATA:  Acute right knee pain after injury 2 days ago. EXAM: RIGHT KNEE - COMPLETE 4+ VIEW COMPARISON:  None. FINDINGS: No evidence of fracture, dislocation, or joint effusion. No evidence of arthropathy or other focal bone abnormality. Soft tissues are unremarkable. IMPRESSION: Negative. Electronically Signed   By: Lupita Raider M.D.   On: 04/22/2020 13:26    Assessment and Plan :   PDMP not reviewed this encounter.  1. Acute pain of right knee   2. Essential  hypertension   3. Elevated blood pressure reading     Recommend conservative management with Ace wrap (applied in clinic), naproxen for pain and inflammation. Refilled Cozaar, follow up with PCP. Counseled patient on potential for adverse effects with medications prescribed/recommended today, ER and return-to-clinic precautions discussed, patient verbalized understanding.    Wallis Bamberg, PA-C 04/22/20 1334

## 2020-04-22 NOTE — ED Triage Notes (Addendum)
Pt in with c/o left leg and knee pain that has been going on for 2 days now  Pt has not had medication for sxs  Pt states that she was going down stairs in her home and hit her knee on the ladder but she is hurting in a different spot  Pt also requesting refill on her cozaar, states she has been out for 1 week

## 2020-04-23 ENCOUNTER — Telehealth: Payer: Self-pay | Admitting: General Practice

## 2020-04-23 NOTE — Telephone Encounter (Signed)
Called patient to schedule an appointment for financial assistance. Scheduled for 05/01/2020. Verified patient address and will mail application to home.   Advised patient that proof of address, income, and assets were required. Patient was asking question further than my knowledge. Please advise further.

## 2020-04-23 NOTE — Telephone Encounter (Signed)
I return Pt call, LVM to call me back 

## 2020-04-27 ENCOUNTER — Ambulatory Visit: Payer: Self-pay | Admitting: Family Medicine

## 2020-05-01 ENCOUNTER — Ambulatory Visit: Payer: Self-pay | Admitting: Family Medicine

## 2020-05-01 ENCOUNTER — Ambulatory Visit: Payer: Self-pay

## 2020-05-12 DIAGNOSIS — Z9181 History of falling: Secondary | ICD-10-CM

## 2020-05-12 DIAGNOSIS — E559 Vitamin D deficiency, unspecified: Secondary | ICD-10-CM

## 2020-05-12 DIAGNOSIS — R519 Headache, unspecified: Secondary | ICD-10-CM

## 2020-05-12 DIAGNOSIS — R42 Dizziness and giddiness: Secondary | ICD-10-CM

## 2020-05-12 HISTORY — DX: Dizziness and giddiness: R42

## 2020-05-12 HISTORY — DX: Vitamin D deficiency, unspecified: E55.9

## 2020-05-12 HISTORY — DX: Headache, unspecified: R51.9

## 2020-05-12 HISTORY — DX: History of falling: Z91.81

## 2020-05-22 ENCOUNTER — Ambulatory Visit: Payer: Self-pay

## 2020-05-22 ENCOUNTER — Other Ambulatory Visit: Payer: Self-pay

## 2020-05-22 ENCOUNTER — Other Ambulatory Visit: Payer: Self-pay | Admitting: Family Medicine

## 2020-05-22 ENCOUNTER — Ambulatory Visit (INDEPENDENT_AMBULATORY_CARE_PROVIDER_SITE_OTHER): Payer: Self-pay | Admitting: Family Medicine

## 2020-05-22 ENCOUNTER — Encounter: Payer: Self-pay | Admitting: Family Medicine

## 2020-05-22 VITALS — BP 134/64 | HR 79 | Temp 97.9°F | Ht 62.0 in | Wt 149.0 lb

## 2020-05-22 DIAGNOSIS — Z758 Other problems related to medical facilities and other health care: Secondary | ICD-10-CM

## 2020-05-22 DIAGNOSIS — R7303 Prediabetes: Secondary | ICD-10-CM

## 2020-05-22 DIAGNOSIS — R739 Hyperglycemia, unspecified: Secondary | ICD-10-CM

## 2020-05-22 DIAGNOSIS — I10 Essential (primary) hypertension: Secondary | ICD-10-CM

## 2020-05-22 DIAGNOSIS — G8929 Other chronic pain: Secondary | ICD-10-CM

## 2020-05-22 DIAGNOSIS — Z7689 Persons encountering health services in other specified circumstances: Secondary | ICD-10-CM

## 2020-05-22 DIAGNOSIS — Z09 Encounter for follow-up examination after completed treatment for conditions other than malignant neoplasm: Secondary | ICD-10-CM

## 2020-05-22 DIAGNOSIS — Z789 Other specified health status: Secondary | ICD-10-CM

## 2020-05-22 DIAGNOSIS — H9191 Unspecified hearing loss, right ear: Secondary | ICD-10-CM

## 2020-05-22 DIAGNOSIS — M25561 Pain in right knee: Secondary | ICD-10-CM

## 2020-05-22 DIAGNOSIS — R42 Dizziness and giddiness: Secondary | ICD-10-CM

## 2020-05-22 DIAGNOSIS — M25562 Pain in left knee: Secondary | ICD-10-CM

## 2020-05-22 DIAGNOSIS — Z0000 Encounter for general adult medical examination without abnormal findings: Secondary | ICD-10-CM

## 2020-05-22 DIAGNOSIS — Z760 Encounter for issue of repeat prescription: Secondary | ICD-10-CM

## 2020-05-22 MED ORDER — MECLIZINE HCL 25 MG PO TABS: 25.0000 mg | ORAL_TABLET | Freq: Three times a day (TID) | ORAL | 3 refills | Status: DC | PRN

## 2020-05-22 MED ORDER — ATORVASTATIN CALCIUM 20 MG PO TABS: 20.0000 mg | ORAL_TABLET | Freq: Every day | ORAL | 3 refills | Status: DC

## 2020-05-22 MED ORDER — LOSARTAN POTASSIUM 50 MG PO TABS: 50.0000 mg | ORAL_TABLET | Freq: Every day | ORAL | 3 refills | Status: DC

## 2020-05-22 MED ORDER — NAPROXEN 500 MG PO TABS: 500.0000 mg | ORAL_TABLET | Freq: Two times a day (BID) | ORAL | 6 refills | Status: DC

## 2020-05-22 NOTE — Progress Notes (Signed)
[facility]    New Patient--Establish Care  Subjective:  Patient ID: Michele Meza, female    DOB: July 19, 1960  Age: 60 y.o. MRN: [id number]  CC:  Chief Complaint  Patient presents with  . New Patient (Initial Visit)    New patient , est care , liver ,kidney lab were abnormal she follow up with pcp. Headaches, pain in rt ,stomach pain     HPI Michele Meza is a 60 year old female who presents for Urgent Care Follow Up and to Establish Care today.    Patient Active Problem List   Diagnosis Date Noted  . Essential hypertension 09/15/2014  . Prediabetes 09/15/2014   Current Status: This will be Ms. Mckesson's initial office visit with me. She was previously seeing Concepcion Living, NP at our office for her PCP needs. She has been lost to follow up. Since her last office visit, she has had an Urgent Care visit for Bilateral Knee Pain r/t an injury. Today, she continues to have chronic bilateral knee pain. Interpreter is present today. She has reports of dizziness and occasional falls. She denies fevers, chills, fatigue, recent infections, weight loss, and night sweats. She has not had any headaches, and visual changes. No chest pain, heart palpitations, cough and shortness of breath reported. No reports of GI problems such as nausea, vomiting, diarrhea, and constipation. She has no reports of blood in stools, dysuria and hematuria. No depression or anxiety reported today. She is taking all medications as prescribed.   Past Medical History:  Diagnosis Date  . Chronic knee pain   . Dizziness 05/2020  . Headache 05/2020  . History of recent fall 05/2020  . Hypertension     History reviewed. No pertinent surgical history.  Family History  Family history unknown: Yes    Social History   Socioeconomic History  . Marital status: Single    Spouse name: Not on file  . Number of children: Not on file  . Years of education: Not on file  .  Highest education level: Not on file  Occupational History  . Not on file  Tobacco Use  . Smoking status: Never Smoker  . Smokeless tobacco: Current User    Types: Chew  Substance and Sexual Activity  . Alcohol use: No  . Drug use: Never  . Sexual activity: Not on file  Other Topics Concern  . Not on file  Social History Narrative  . Not on file   Social Determinants of Health   Financial Resource Strain: Not on file  Food Insecurity: Not on file  Transportation Needs: Not on file  Physical Activity: Not on file  Stress: Not on file  Social Connections: Not on file  Intimate Partner Violence: Not on file    Outpatient Medications Prior to Visit  Medication Sig Dispense Refill  . losartan (COZAAR) 50 MG tablet Take 1 tablet (50 mg total) by mouth daily. 30 tablet 0  . diphenhydrAMINE (BENADRYL) 25 MG tablet Take 1 tablet (25 mg total) by mouth every 6 (six) hours. (Patient not taking: Reported on 05/22/2020) 20 tablet 0  . ondansetron (ZOFRAN ODT) 4 MG disintegrating tablet Take 1 tablet (4 mg total) by mouth every 8 (eight) hours as needed for nausea or vomiting. (Patient not taking: Reported on 05/22/2020) 20 tablet 0  . atorvastatin (LIPITOR) 20 MG tablet Take 1 tablet (20 mg total) by mouth daily. (Patient not taking: Reported on 05/22/2020) 90 tablet 3  .  meclizine (ANTIVERT) 25 MG tablet Take 1 tablet (25 mg total) by mouth 3 (three) times daily as needed for dizziness. (Patient not taking: Reported on 05/22/2020) 30 tablet 0  . naproxen (NAPROSYN) 375 MG tablet Take 1 tablet (375 mg total) by mouth 2 (two) times daily with a meal. (Patient not taking: Reported on 05/22/2020) 30 tablet 0   No facility-administered medications prior to visit.    No Known Allergies  ROS Review of Systems  Constitutional: Negative.   HENT: Negative.   Eyes: Negative.   Respiratory: Negative.   Cardiovascular: Negative.   Gastrointestinal: Negative.   Endocrine: Negative.    Genitourinary: Negative.   Musculoskeletal: Positive for arthralgias (Generalized joint pain).       Bilateral Chronic Knee Pain  Skin: Negative.   Allergic/Immunologic: Negative.   Neurological: Positive for dizziness (frequent), weakness (occasional) and headaches (occasional).  Hematological: Negative.   Psychiatric/Behavioral: Negative.       Objective:    Physical Exam Vitals and nursing note reviewed.  Constitutional:      Appearance: Normal appearance.  HENT:     Head: Normocephalic and atraumatic.     Nose: Nose normal.     Mouth/Throat:     Mouth: Mucous membranes are moist.     Pharynx: Oropharynx is clear.  Cardiovascular:     Rate and Rhythm: Normal rate and regular rhythm.     Pulses: Normal pulses.     Heart sounds: Normal heart sounds.  Pulmonary:     Effort: Pulmonary effort is normal.     Breath sounds: Normal breath sounds.  Abdominal:     General: Bowel sounds are normal.     Palpations: Abdomen is soft.  Musculoskeletal:     Cervical back: Normal range of motion and neck supple.     Comments: Limited ROM bilateral knees  Skin:    General: Skin is warm and dry.  Neurological:     General: No focal deficit present.     Mental Status: She is alert and oriented to person, place, and time.  Psychiatric:        Mood and Affect: Mood normal.        Behavior: Behavior normal.        Thought Content: Thought content normal.        Judgment: Judgment normal.     BP 134/64 (BP Location: Left Arm, Patient Position: Sitting, Cuff Size: Normal)   Pulse 79   Temp 97.9 F (36.6 C) (Temporal)   Ht 5\' 2"  (1.575 m)   Wt 149 lb (67.6 kg)   LMP 05/10/2011   SpO2 99%   BMI 27.25 kg/m  Wt Readings from Last 3 Encounters:  05/22/20 149 lb (67.6 kg)  02/02/15 155 lb (70.3 kg)  12/29/14 159 lb (72.1 kg)     Health Maintenance Due  Topic Date Due  . HIV Screening  Never done  . COLONOSCOPY (Pts 45-84yrs Insurance coverage will need to be confirmed)   Never done  . MAMMOGRAM  05/09/2013  . PAP SMEAR-Modifier  02/01/2018  . INFLUENZA VACCINE  11/10/2019    There are no preventive care reminders to display for this patient.  Lab Results  Component Value Date   TSH 1.083 07/26/2013   Lab Results  Component Value Date   WBC 6.5 07/26/2013   HGB 15.3 (H) 07/26/2013   HCT 44.4 07/26/2013   MCV 92.9 07/26/2013   PLT 295 07/26/2013   Lab Results  Component Value Date  NA 135 02/15/2020   K 3.7 02/15/2020   CO2 24 02/15/2020   GLUCOSE 145 (H) 02/15/2020   BUN <5 (L) 02/15/2020   CREATININE 0.75 02/15/2020   BILITOT 1.8 (H) 02/15/2020   ALKPHOS 127 (H) 02/15/2020   AST 116 (H) 02/15/2020   ALT 40 02/15/2020   PROT 8.4 (H) 02/15/2020   ALBUMIN 2.9 (L) 02/15/2020   CALCIUM 8.6 (L) 02/15/2020   ANIONGAP 10 02/15/2020   Lab Results  Component Value Date   CHOL 212 (H) 12/29/2014   Lab Results  Component Value Date   HDL 49 12/29/2014   Lab Results  Component Value Date   LDLCALC 138 (H) 12/29/2014   Lab Results  Component Value Date   TRIG 123 12/29/2014   Lab Results  Component Value Date   CHOLHDL 4.3 12/29/2014   Lab Results  Component Value Date   HGBA1C 6.3 (H) 09/15/2014    Assessment & Plan:   1. Language barrier Spoke with son in lobby on importance of listening to voicemail messages for treatment plan for his mother and follow up.  2. Hospital discharge follow-up  3. Re-establish care  4. Chronic pain of both knees - naproxen (NAPROSYN) 500 MG tablet; Take 1 tablet (500 mg total) by mouth 2 (two) times daily with a meal.  Dispense: 60 tablet; Refill: 6  5. Prediabetes - Hemoglobin A1c  6. Hyperglycemia - Hemoglobin A1c  7. Essential hypertension The current medical regimen is effective; blood pressure is stable at 134/64 today; continue present plan and medications as prescribed. She will continue to take medications as prescribed, to decrease high sodium intake, excessive alcohol  intake, increase potassium intake, smoking cessation, and increase physical activity of at least 30 minutes of cardio activity daily. She will continue to follow Heart Healthy or DASH diet. - losartan (COZAAR) 50 MG tablet; Take 1 tablet (50 mg total) by mouth daily.  Dispense: 90 tablet; Refill: 3 - atorvastatin (LIPITOR) 20 MG tablet; Take 1 tablet (20 mg total) by mouth daily.  Dispense: 90 tablet; Refill: 3  8. 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 Dizziness - meclizine (ANTIVERT) 25 MG tablet; Take 1 tablet (25 mg total) by mouth 3 (three) times daily as needed for dizziness.  Dispense: 90 tablet; Refill: 3  10. Healthcare maintenance - CBC with Differential - Comprehensive metabolic panel - Lipid Panel - TSH - Vitamin B12 - Vitamin D, 25-hydroxy - Urinalysis  11. Medication refill - losartan (COZAAR) 50 MG tablet; Take 1 tablet (50 mg total) by mouth daily.  Dispense: 90 tablet; Refill: 3 - atorvastatin (LIPITOR) 20 MG tablet; Take 1 tablet (20 mg total) by mouth daily.  Dispense: 90 tablet; Refill: 3 - meclizine (ANTIVERT) 25 MG tablet; Take 1 tablet (25 mg total) by mouth 3 (three) times daily as needed for dizziness.  Dispense: 90 tablet; Refill: 3 - naproxen (NAPROSYN) 500 MG tablet; Take 1 tablet (500 mg total) by mouth 2 (two) times daily with a meal.  Dispense: 60 tablet; Refill: 6  12. Follow up She will follow up for Pap Smear in 1 month  Meds ordered this encounter  Medications  . losartan (COZAAR) 50 MG tablet    Sig: Take 1 tablet (50 mg total) by mouth daily.    Dispense:  90 tablet    Refill:  3  . atorvastatin (LIPITOR) 20 MG tablet    Sig: Take 1 tablet (20 mg total) by mouth daily.  Dispense:  90 tablet    Refill:  3  . meclizine (ANTIVERT) 25 MG tablet    Sig: Take 1 tablet (25 mg total) by mouth 3 (three) times daily as needed for dizziness.    Dispense:  90 tablet    Refill:  3  . naproxen (NAPROSYN) 500 MG tablet    Sig: Take 1  tablet (500 mg total) by mouth 2 (two) times daily with a meal.    Dispense:  60 tablet    Refill:  6    Orders Placed This Encounter  Procedures  . CBC with Differential  . Comprehensive metabolic panel  . Lipid Panel  . TSH  . Vitamin B12  . Vitamin D, 25-hydroxy  . Hemoglobin A1c  . Urinalysis    Referral Orders  No referral(s) requested today    Raliegh Ip, MSN, ANE, FNP-BC [facility] [address] [place], [place] [address] [phone]   Problem List Items Addressed This Visit      Cardiovascular and Mediastinum   Essential hypertension   Relevant Medications   losartan (COZAAR) 50 MG tablet   atorvastatin (LIPITOR) 20 MG tablet     Other   Prediabetes   Relevant Orders   Hemoglobin A1c    Other Visit Diagnoses    Language barrier    -  Primary   Hospital discharge follow-up       Encounter to establish care       Chronic pain of both knees       Relevant Medications   naproxen (NAPROSYN) 500 MG tablet   Hyperglycemia       Relevant Orders   Hemoglobin A1c   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 losartan (COZAAR) 50 MG tablet    Sig: Take 1 tablet (50 mg total) by mouth daily.    Dispense:  90 tablet    Refill:  3  . atorvastatin (LIPITOR) 20 MG tablet    Sig: Take 1 tablet (20 mg total) by mouth daily.     Dispense:  90 tablet    Refill:  3  . meclizine (ANTIVERT) 25 MG tablet    Sig: Take 1 tablet (25 mg total) by mouth 3 (three) times daily as needed for dizziness.    Dispense:  90 tablet    Refill:  3  . naproxen (NAPROSYN) 500 MG tablet    Sig: Take 1 tablet (500 mg total) by mouth 2 (two) times daily with a meal.    Dispense:  60 tablet    Refill:  6    Follow-up: No follow-ups on file.    Kallie Locks, FNP

## 2020-05-23 LAB — COMPREHENSIVE METABOLIC PANEL
ALT: 15 IU/L (ref 0–32)
AST: 22 IU/L (ref 0–40)
Albumin/Globulin Ratio: 0.9 — ABNORMAL LOW (ref 1.2–2.2)
Albumin: 3.5 g/dL — ABNORMAL LOW (ref 3.8–4.9)
Alkaline Phosphatase: 155 IU/L — ABNORMAL HIGH (ref 44–121)
BUN/Creatinine Ratio: 11 (ref 9–23)
BUN: 9 mg/dL (ref 6–24)
Bilirubin Total: 0.4 mg/dL (ref 0.0–1.2)
CO2: 21 mmol/L (ref 20–29)
Calcium: 9.7 mg/dL (ref 8.7–10.2)
Chloride: 103 mmol/L (ref 96–106)
Creatinine, Ser: 0.8 mg/dL (ref 0.57–1.00)
GFR calc Af Amer: 94 mL/min/{1.73_m2} (ref >59)
GFR calc non Af Amer: 82 mL/min/{1.73_m2} (ref >59)
Globulin, Total: 4.1 g/dL (ref 1.5–4.5)
Glucose: 129 mg/dL — ABNORMAL HIGH (ref 65–99)
Potassium: 4.2 mmol/L (ref 3.5–5.2)
Sodium: 140 mmol/L (ref 134–144)
Total Protein: 7.6 g/dL (ref 6.0–8.5)

## 2020-05-23 LAB — LIPID PANEL
Chol/HDL Ratio: 2.9 ratio (ref 0.0–4.4)
Cholesterol, Total: 164 mg/dL (ref 100–199)
HDL: 56 mg/dL (ref >39)
LDL Chol Calc (NIH): 90 mg/dL (ref 0–99)
Triglycerides: 100 mg/dL (ref 0–149)
VLDL Cholesterol Cal: 18 mg/dL (ref 5–40)

## 2020-05-23 LAB — CBC WITH DIFFERENTIAL/PLATELET
Basophils Absolute: 0 10*3/uL (ref 0.0–0.2)
Basos: 1 %
EOS (ABSOLUTE): 0.7 10*3/uL — ABNORMAL HIGH (ref 0.0–0.4)
Eos: 15 %
Hematocrit: 34.8 % (ref 34.0–46.6)
Hemoglobin: 12 g/dL (ref 11.1–15.9)
Immature Grans (Abs): 0 10*3/uL (ref 0.0–0.1)
Immature Granulocytes: 0 %
Lymphocytes Absolute: 2.1 10*3/uL (ref 0.7–3.1)
Lymphs: 47 %
MCH: 33.7 pg — ABNORMAL HIGH (ref 26.6–33.0)
MCHC: 34.5 g/dL (ref 31.5–35.7)
MCV: 98 fL — ABNORMAL HIGH (ref 79–97)
Monocytes Absolute: 0.5 10*3/uL (ref 0.1–0.9)
Monocytes: 10 %
Neutrophils Absolute: 1.2 10*3/uL — ABNORMAL LOW (ref 1.4–7.0)
Neutrophils: 27 %
Platelets: 188 10*3/uL (ref 150–450)
RBC: 3.56 x10E6/uL — ABNORMAL LOW (ref 3.77–5.28)
RDW: 11.4 % — ABNORMAL LOW (ref 11.7–15.4)
WBC: 4.5 10*3/uL (ref 3.4–10.8)

## 2020-05-23 LAB — URINALYSIS
Bilirubin, UA: NEGATIVE
Glucose, UA: NEGATIVE
Ketones, UA: NEGATIVE
Leukocytes,UA: NEGATIVE
Nitrite, UA: NEGATIVE
Protein,UA: NEGATIVE
RBC, UA: NEGATIVE
Specific Gravity, UA: 1.006 (ref 1.005–1.030)
Urobilinogen, Ur: 0.2 mg/dL (ref 0.2–1.0)
pH, UA: 7 (ref 5.0–7.5)

## 2020-05-23 LAB — VITAMIN D 25 HYDROXY (VIT D DEFICIENCY, FRACTURES): Vit D, 25-Hydroxy: 12.4 ng/mL — ABNORMAL LOW (ref 30.0–100.0)

## 2020-05-23 LAB — HEMOGLOBIN A1C
Est. average glucose Bld gHb Est-mCnc: 126 mg/dL
Hgb A1c MFr Bld: 6 % — ABNORMAL HIGH (ref 4.8–5.6)

## 2020-05-23 LAB — TSH: TSH: 1.08 u[IU]/mL (ref 0.450–4.500)

## 2020-05-23 LAB — VITAMIN B12: Vitamin B-12: 517 pg/mL (ref 232–1245)

## 2020-05-25 ENCOUNTER — Other Ambulatory Visit: Payer: Self-pay | Admitting: Family Medicine

## 2020-05-25 DIAGNOSIS — E559 Vitamin D deficiency, unspecified: Secondary | ICD-10-CM

## 2020-05-25 MED ORDER — VITAMIN D (ERGOCALCIFEROL) 1.25 MG (50000 UNIT) PO CAPS: 50000.0000 [IU] | ORAL_CAPSULE | ORAL | 0 refills | Status: DC

## 2020-05-26 ENCOUNTER — Telehealth: Payer: Self-pay

## 2020-05-26 NOTE — Telephone Encounter (Signed)
-----   Message from Kallie Locks, FNP sent at 05/25/2020  4:10 PM EST ----- ##Interpreter Needed. Son and daughter can also interpret## Vitamin D level is moderately low. Rx for Vitamin D supplement sent to pharmacy today. She will take medication once weekly as prescribed. She should include foods that are high in Vitamin D. These include: Salmon, Cod Liver Oil, Mushrooms, Canned Fish, Milk, and Egg Yolks.   Liver function levels are within normal range (this is what patient/family was concerned about).  All other labs are stable. Keep follow up appointment.

## 2020-05-26 NOTE — Telephone Encounter (Signed)
Attempted to reach patient.  Unable to leave message.  Results mailed.

## 2020-05-29 ENCOUNTER — Other Ambulatory Visit: Payer: Self-pay

## 2020-05-29 ENCOUNTER — Ambulatory Visit: Payer: Self-pay | Attending: Family Medicine

## 2020-06-26 ENCOUNTER — Other Ambulatory Visit: Payer: Self-pay | Admitting: Family Medicine

## 2020-06-26 ENCOUNTER — Ambulatory Visit (INDEPENDENT_AMBULATORY_CARE_PROVIDER_SITE_OTHER): Payer: Self-pay | Admitting: Family Medicine

## 2020-06-26 ENCOUNTER — Encounter: Payer: Self-pay | Admitting: Family Medicine

## 2020-06-26 ENCOUNTER — Other Ambulatory Visit: Payer: Self-pay

## 2020-06-26 VITALS — BP 105/55 | HR 80 | Ht 62.0 in | Wt 141.0 lb

## 2020-06-26 DIAGNOSIS — Z789 Other specified health status: Secondary | ICD-10-CM

## 2020-06-26 DIAGNOSIS — Z758 Other problems related to medical facilities and other health care: Secondary | ICD-10-CM

## 2020-06-26 DIAGNOSIS — E559 Vitamin D deficiency, unspecified: Secondary | ICD-10-CM

## 2020-06-26 DIAGNOSIS — R7989 Other specified abnormal findings of blood chemistry: Secondary | ICD-10-CM

## 2020-06-26 DIAGNOSIS — M25562 Pain in left knee: Secondary | ICD-10-CM

## 2020-06-26 DIAGNOSIS — I10 Essential (primary) hypertension: Secondary | ICD-10-CM

## 2020-06-26 DIAGNOSIS — R42 Dizziness and giddiness: Secondary | ICD-10-CM

## 2020-06-26 DIAGNOSIS — Z09 Encounter for follow-up examination after completed treatment for conditions other than malignant neoplasm: Secondary | ICD-10-CM

## 2020-06-26 DIAGNOSIS — G8929 Other chronic pain: Secondary | ICD-10-CM

## 2020-06-26 DIAGNOSIS — M25561 Pain in right knee: Secondary | ICD-10-CM

## 2020-06-26 DIAGNOSIS — K219 Gastro-esophageal reflux disease without esophagitis: Secondary | ICD-10-CM

## 2020-06-26 DIAGNOSIS — R945 Abnormal results of liver function studies: Secondary | ICD-10-CM

## 2020-06-26 MED ORDER — VITAMIN D (ERGOCALCIFEROL) 1.25 MG (50000 UNIT) PO CAPS: 50000.0000 [IU] | ORAL_CAPSULE | ORAL | 0 refills | Status: AC

## 2020-06-26 MED ORDER — FAMOTIDINE 20 MG PO TABS: 20.0000 mg | ORAL_TABLET | Freq: Two times a day (BID) | ORAL | 6 refills | Status: DC

## 2020-06-26 MED ORDER — VITAMIN D (ERGOCALCIFEROL) 1.25 MG (50000 UNIT) PO CAPS: 50000.0000 [IU] | ORAL_CAPSULE | ORAL | 0 refills | Status: DC

## 2020-06-26 NOTE — Progress Notes (Signed)
[facility]    Established Patient Office Visit  Subjective:  Patient ID: Michele Meza, female    DOB: 1961-02-03  Age: 60 y.o. MRN: [id number]  CC: No chief complaint on file.   HPI Michele Meza is a 60 year old female who presents for Follow Up today.    Patient Active Problem List   Diagnosis Date Noted  . Essential hypertension 09/15/2014  . Prediabetes 09/15/2014    Current Status: Since her last office visit, she is doing well with no complaints.  Language interpreter used today for effective communication. She denies visual changes, chest pain, cough, shortness of breath, heart palpitations, and falls. She has occasional headaches and dizziness with position changes. Denies severe headaches, confusion, seizures, double vision, and blurred vision, nausea and vomiting. She denies fevers, chills, fatigue, recent infections, weight loss, and night sweats. Denies GI problems such as diarrhea, and constipation. She has no reports of blood in stools, dysuria and hematuria. No depression or anxiety reported today. She has not began taking Vitamin D supply.   Past Medical History:  Diagnosis Date  . Chronic knee pain   . Dizziness 05/2020  . Headache 05/2020  . History of recent fall 05/2020  . Hypertension   . Vitamin D deficiency 05/2020    No past surgical history on file.  Family History  Family history unknown: Yes    Social History   Socioeconomic History  . Marital status: Single    Spouse name: Not on file  . Number of children: Not on file  . Years of education: Not on file  . Highest education level: Not on file  Occupational History  . Not on file  Tobacco Use  . Smoking status: Never Smoker  . Smokeless tobacco: Current User    Types: Chew  Substance and Sexual Activity  . Alcohol use: No  . Drug use: Never  . Sexual activity: Not on file  Other Topics Concern  . Not on file  Social History Narrative   . Not on file   Social Determinants of Health   Financial Resource Strain: Not on file  Food Insecurity: Not on file  Transportation Needs: Not on file  Physical Activity: Not on file  Stress: Not on file  Social Connections: Not on file  Intimate Partner Violence: Not on file    Outpatient Medications Prior to Visit  Medication Sig Dispense Refill  . atorvastatin (LIPITOR) 20 MG tablet Take 1 tablet (20 mg total) by mouth daily. 90 tablet 3  . losartan (COZAAR) 50 MG tablet Take 1 tablet (50 mg total) by mouth daily. 90 tablet 3  . meclizine (ANTIVERT) 25 MG tablet Take 1 tablet (25 mg total) by mouth 3 (three) times daily as needed for dizziness. 90 tablet 3  . naproxen (NAPROSYN) 500 MG tablet Take 1 tablet (500 mg total) by mouth 2 (two) times daily with a meal. 60 tablet 6  . Vitamin D, Ergocalciferol, (DRISDOL) 1.25 MG (50000 UNIT) CAPS capsule Take 1 capsule (50,000 Units total) by mouth every 7 (seven) days. 15 capsule 0  . diphenhydrAMINE (BENADRYL) 25 MG tablet Take 1 tablet (25 mg total) by mouth every 6 (six) hours. (Patient not taking: Reported on 05/22/2020) 20 tablet 0  . ondansetron (ZOFRAN ODT) 4 MG disintegrating tablet Take 1 tablet (4 mg total) by mouth every 8 (eight) hours as needed for nausea or vomiting. (Patient not taking: Reported on 05/22/2020) 20 tablet 0  No facility-administered medications prior to visit.    No Known Allergies  ROS Review of Systems  Constitutional: Negative.   HENT: Negative.   Eyes: Negative.   Respiratory: Negative.   Cardiovascular: Negative.   Gastrointestinal: Positive for abdominal pain (r/t acid reflux).  Endocrine: Negative.   Genitourinary: Negative.   Musculoskeletal: Positive for arthralgias (generalized joint pain).  Skin: Negative.   Allergic/Immunologic: Negative.   Neurological: Positive for dizziness (frequent) and headaches (occasional ).  Hematological: Negative.   Psychiatric/Behavioral: Negative.        Objective:    Physical Exam Vitals and nursing note reviewed.  Constitutional:      Appearance: Normal appearance.  HENT:     Head: Normocephalic and atraumatic.     Nose: Nose normal.     Mouth/Throat:     Mouth: Mucous membranes are moist.     Pharynx: Oropharynx is clear.  Cardiovascular:     Rate and Rhythm: Normal rate and regular rhythm.     Pulses: Normal pulses.     Heart sounds: Normal heart sounds.  Pulmonary:     Effort: Pulmonary effort is normal.     Breath sounds: Normal breath sounds.  Abdominal:     General: Bowel sounds are normal.     Palpations: Abdomen is soft.  Musculoskeletal:        General: Normal range of motion.     Cervical back: Normal range of motion and neck supple.  Skin:    General: Skin is warm and dry.  Neurological:     General: No focal deficit present.     Mental Status: She is alert and oriented to person, place, and time.  Psychiatric:        Mood and Affect: Mood normal.        Behavior: Behavior normal.        Thought Content: Thought content normal.        Judgment: Judgment normal.     BP (!) 105/55   Pulse 80   Ht 5\' 2"  (1.575 m)   Wt 141 lb (64 kg)   LMP 05/10/2011   SpO2 100%   BMI 25.79 kg/m  Wt Readings from Last 3 Encounters:  06/26/20 141 lb (64 kg)  05/22/20 149 lb (67.6 kg)  02/02/15 155 lb (70.3 kg)     Health Maintenance Due  Topic Date Due  . HIV Screening  Never done  . COLONOSCOPY (Pts 45-17yrs Insurance coverage will need to be confirmed)  Never done  . MAMMOGRAM  05/09/2013  . PAP SMEAR-Modifier  02/01/2018  . INFLUENZA VACCINE  11/10/2019    There are no preventive care reminders to display for this patient.  Lab Results  Component Value Date   TSH 1.080 05/22/2020   Lab Results  Component Value Date   WBC 4.5 05/22/2020   HGB 12.0 05/22/2020   HCT 34.8 05/22/2020   MCV 98 (H) 05/22/2020   PLT 188 05/22/2020   Lab Results  Component Value Date   NA 140 05/22/2020   K 4.2  05/22/2020   CO2 21 05/22/2020   GLUCOSE 129 (H) 05/22/2020   BUN 9 05/22/2020   CREATININE 0.80 05/22/2020   BILITOT 0.4 06/26/2020   ALKPHOS 130 (H) 06/26/2020   AST 20 06/26/2020   ALT 13 06/26/2020   PROT 7.0 06/26/2020   ALBUMIN 3.4 (L) 06/26/2020   CALCIUM 9.7 05/22/2020   ANIONGAP 10 02/15/2020   Lab Results  Component Value Date   CHOL  164 05/22/2020   Lab Results  Component Value Date   HDL 56 05/22/2020   Lab Results  Component Value Date   LDLCALC 90 05/22/2020   Lab Results  Component Value Date   TRIG 100 05/22/2020   Lab Results  Component Value Date   CHOLHDL 2.9 05/22/2020   Lab Results  Component Value Date   HGBA1C 6.0 (H) 05/22/2020    Assessment & Plan:   1. Language barrier - Vitamin D, Ergocalciferol, (DRISDOL) 1.25 MG (50000 UNIT) CAPS capsule; Take 1 capsule (50,000 Units total) by mouth every 7 (seven) days.  Dispense: 15 capsule; Refill: 0 - famotidine (PEPCID) 20 MG tablet; Take 1 tablet (20 mg total) by mouth 2 (two) times daily.  Dispense: 60 tablet; Refill: 6  2. Essential hypertension The current medical regimen is effective; blood pressure is stable at 105/55 today; continue present plan and medications as prescribed. She will continue to take medications as prescribed, to decrease high sodium intake, excessive alcohol intake, increase potassium intake, smoking cessation, and increase physical activity of at least 30 minutes of cardio activity daily. She will continue to follow Heart Healthy or DASH diet. - Vitamin D, Ergocalciferol, (DRISDOL) 1.25 MG (50000 UNIT) CAPS capsule; Take 1 capsule (50,000 Units total) by mouth every 7 (seven) days.  Dispense: 15 capsule; Refill: 0 - famotidine (PEPCID) 20 MG tablet; Take 1 tablet (20 mg total) by mouth 2 (two) times daily.  Dispense: 60 tablet; Refill: 6  3. Chronic pain of both knees - Vitamin D, Ergocalciferol, (DRISDOL) 1.25 MG (50000 UNIT) CAPS capsule; Take 1 capsule (50,000 Units  total) by mouth every 7 (seven) days.  Dispense: 15 capsule; Refill: 0 - famotidine (PEPCID) 20 MG tablet; Take 1 tablet (20 mg total) by mouth 2 (two) times daily.  Dispense: 60 tablet; Refill: 6  4. Dizziness - Vitamin D, Ergocalciferol, (DRISDOL) 1.25 MG (50000 UNIT) CAPS capsule; Take 1 capsule (50,000 Units total) by mouth every 7 (seven) days.  Dispense: 15 capsule; Refill: 0 - famotidine (PEPCID) 20 MG tablet; Take 1 tablet (20 mg total) by mouth 2 (two) times daily.  Dispense: 60 tablet; Refill: 6  5. Vitamin D deficiency - Vitamin D, Ergocalciferol, (DRISDOL) 1.25 MG (50000 UNIT) CAPS capsule; Take 1 capsule (50,000 Units total) by mouth every 7 (seven) days.  Dispense: 15 capsule; Refill: 0 - famotidine (PEPCID) 20 MG tablet; Take 1 tablet (20 mg total) by mouth 2 (two) times daily.  Dispense: 60 tablet; Refill: 6  6. Abnormal LFTs (liver function tests) - Hepatic Function Panel - Vitamin D, Ergocalciferol, (DRISDOL) 1.25 MG (50000 UNIT) CAPS capsule; Take 1 capsule (50,000 Units total) by mouth every 7 (seven) days.  Dispense: 15 capsule; Refill: 0 - famotidine (PEPCID) 20 MG tablet; Take 1 tablet (20 mg total) by mouth 2 (two) times daily.  Dispense: 60 tablet; Refill: 6  7. Gastroesophageal reflux disease without esophagitis - Vitamin D, Ergocalciferol, (DRISDOL) 1.25 MG (50000 UNIT) CAPS capsule; Take 1 capsule (50,000 Units total) by mouth every 7 (seven) days.  Dispense: 15 capsule; Refill: 0 - famotidine (PEPCID) 20 MG tablet; Take 1 tablet (20 mg total) by mouth 2 (two) times daily.  Dispense: 60 tablet; Refill: 6  8. Follow up She will follow up in 6 months. - Vitamin D, Ergocalciferol, (DRISDOL) 1.25 MG (50000 UNIT) CAPS capsule; Take 1 capsule (50,000 Units total) by mouth every 7 (seven) days.  Dispense: 15 capsule; Refill: 0 - famotidine (PEPCID) 20 MG tablet; Take 1  tablet (20 mg total) by mouth 2 (two) times daily.  Dispense: 60 tablet; Refill: 6  Meds ordered this  encounter  Medications  . DISCONTD: Vitamin D, Ergocalciferol, (DRISDOL) 1.25 MG (50000 UNIT) CAPS capsule    Sig: Take 1 capsule (50,000 Units total) by mouth every 7 (seven) days.    Dispense:  15 capsule    Refill:  0  . DISCONTD: famotidine (PEPCID) 20 MG tablet    Sig: Take 1 tablet (20 mg total) by mouth 2 (two) times daily.    Dispense:  60 tablet    Refill:  6  . Vitamin D, Ergocalciferol, (DRISDOL) 1.25 MG (50000 UNIT) CAPS capsule    Sig: Take 1 capsule (50,000 Units total) by mouth every 7 (seven) days.    Dispense:  15 capsule    Refill:  0  . famotidine (PEPCID) 20 MG tablet    Sig: Take 1 tablet (20 mg total) by mouth 2 (two) times daily.    Dispense:  60 tablet    Refill:  6    Orders Placed This Encounter  Procedures  . Hepatic Function Panel    Orders Placed This Encounter  Procedures  . Hepatic Function Panel    Raliegh Ip, MSN, ANE, FNP-BC [facility] [address] [place], [place] [address] [phone]   Problem List Items Addressed This Visit      Cardiovascular and Mediastinum   Essential hypertension   Relevant Medications   Vitamin D, Ergocalciferol, (DRISDOL) 1.25 MG (50000 UNIT) CAPS capsule   famotidine (PEPCID) 20 MG tablet    Other Visit Diagnoses    Language barrier    -  Primary   Relevant Medications   Vitamin D, Ergocalciferol, (DRISDOL) 1.25 MG (50000 UNIT) CAPS capsule   famotidine (PEPCID) 20 MG tablet   Chronic pain of both knees       Relevant Medications   Vitamin D, Ergocalciferol, (DRISDOL) 1.25 MG (50000 UNIT) CAPS capsule   famotidine (PEPCID) 20 MG tablet   Dizziness       Relevant Medications   Vitamin D, Ergocalciferol, (DRISDOL) 1.25 MG (50000 UNIT) CAPS capsule   famotidine (PEPCID) 20 MG tablet   Vitamin D deficiency       Relevant Medications   Vitamin D, Ergocalciferol, (DRISDOL) 1.25 MG (50000  UNIT) CAPS capsule   famotidine (PEPCID) 20 MG tablet   Abnormal LFTs (liver function tests)       Relevant Medications   Vitamin D, Ergocalciferol, (DRISDOL) 1.25 MG (50000 UNIT) CAPS capsule   famotidine (PEPCID) 20 MG tablet   Other Relevant Orders   Hepatic Function Panel (Completed)   Gastroesophageal reflux disease without esophagitis       Relevant Medications   Vitamin D, Ergocalciferol, (DRISDOL) 1.25 MG (50000 UNIT) CAPS capsule   famotidine (PEPCID) 20 MG tablet   Follow up       Relevant Medications   Vitamin D, Ergocalciferol, (DRISDOL) 1.25 MG (50000 UNIT) CAPS capsule   famotidine (PEPCID) 20 MG tablet      Meds ordered this encounter  Medications  . DISCONTD: Vitamin D, Ergocalciferol, (DRISDOL) 1.25 MG (50000 UNIT) CAPS capsule    Sig: Take 1 capsule (50,000 Units total) by mouth every 7 (seven) days.    Dispense:  15 capsule    Refill:  0  . DISCONTD: famotidine (PEPCID) 20 MG tablet    Sig: Take 1 tablet (20 mg total)  by mouth 2 (two) times daily.    Dispense:  60 tablet    Refill:  6  . Vitamin D, Ergocalciferol, (DRISDOL) 1.25 MG (50000 UNIT) CAPS capsule    Sig: Take 1 capsule (50,000 Units total) by mouth every 7 (seven) days.    Dispense:  15 capsule    Refill:  0  . famotidine (PEPCID) 20 MG tablet    Sig: Take 1 tablet (20 mg total) by mouth 2 (two) times daily.    Dispense:  60 tablet    Refill:  6    Follow-up: No follow-ups on file.    Kallie Locks, FNP

## 2020-06-26 NOTE — Patient Instructions (Signed)
Gastroesophageal Reflux Disease, Adult  Gastroesophageal reflux (GER) happens when acid from the stomach flows up into the tube that connects the mouth and the stomach (esophagus). Normally, food travels down the esophagus and stays in the stomach to be digested. With GER, food and stomach acid sometimes move back up into the esophagus. You may have a disease called gastroesophageal reflux disease (GERD) if the reflux:  Happens often.  Causes frequent or very bad symptoms.  Causes problems such as damage to the esophagus. When this happens, the esophagus becomes sore and swollen. Over time, GERD can make small holes (ulcers) in the lining of the esophagus. What are the causes? This condition is caused by a problem with the muscle between the esophagus and the stomach. When this muscle is weak or not normal, it does not close properly to keep food and acid from coming back up from the stomach. The muscle can be weak because of:  Tobacco use.  Pregnancy.  Having a certain type of hernia (hiatal hernia).  Alcohol use.  Certain foods and drinks, such as coffee, chocolate, onions, and peppermint. What increases the risk?  Being overweight.  Having a disease that affects your connective tissue.  Taking NSAIDs, such a ibuprofen. What are the signs or symptoms?  Heartburn.  Difficult or painful swallowing.  The feeling of having a lump in the throat.  A bitter taste in the mouth.  Bad breath.  Having a lot of saliva.  Having an upset or bloated stomach.  Burping.  Chest pain. Different conditions can cause chest pain. Make sure you see your doctor if you have chest pain.  Shortness of breath or wheezing.  A long-term cough or a cough at night.  Wearing away of the surface of teeth (tooth enamel).  Weight loss. How is this treated?  Making changes to your diet.  Taking medicine.  Having surgery. Treatment will depend on how bad your symptoms are. Follow these  instructions at home: Eating and drinking  Follow a diet as told by your doctor. You may need to avoid foods and drinks such as: ? Coffee and tea, with or without caffeine. ? Drinks that contain alcohol. ? Energy drinks and sports drinks. ? Bubbly (carbonated) drinks or sodas. ? Chocolate and cocoa. ? Peppermint and mint flavorings. ? Garlic and onions. ? Horseradish. ? Spicy and acidic foods. These include peppers, chili powder, curry powder, vinegar, hot sauces, and BBQ sauce. ? Citrus fruit juices and citrus fruits, such as oranges, lemons, and limes. ? Tomato-based foods. These include red sauce, chili, salsa, and pizza with red sauce. ? Fried and fatty foods. These include donuts, french fries, potato chips, and high-fat dressings. ? High-fat meats. These include hot dogs, rib eye steak, sausage, ham, and bacon. ? High-fat dairy items, such as whole milk, butter, and cream cheese.  Eat small meals often. Avoid eating large meals.  Avoid drinking large amounts of liquid with your meals.  Avoid eating meals during the 2-3 hours before bedtime.  Avoid lying down right after you eat.  Do not exercise right after you eat.   Lifestyle  Do not smoke or use any products that contain nicotine or tobacco. If you need help quitting, ask your doctor.  Try to lower your stress. If you need help doing this, ask your doctor.  If you are overweight, lose an amount of weight that is healthy for you. Ask your doctor about a safe weight loss goal.   General instructions    Pay attention to any changes in your symptoms.  Take over-the-counter and prescription medicines only as told by your doctor.  Do not take aspirin, ibuprofen, or other NSAIDs unless your doctor says it is okay.  Wear loose clothes. Do not wear anything tight around your waist.  Raise (elevate) the head of your bed about 6 inches (15 cm). You may need to use a wedge to do this.  Avoid bending over if this makes your  symptoms worse.  Keep all follow-up visits. Contact a doctor if:  You have new symptoms.  You lose weight and you do not know why.  You have trouble swallowing or it hurts to swallow.  You have wheezing or a cough that keeps happening.  You have a hoarse voice.  Your symptoms do not get better with treatment. Get help right away if:  You have sudden pain in your arms, neck, jaw, teeth, or back.  You suddenly feel sweaty, dizzy, or light-headed.  You have chest pain or shortness of breath.  You vomit and the vomit is green, yellow, or black, or it looks like blood or coffee grounds.  You faint.  Your poop (stool) is red, bloody, or black.  You cannot swallow, drink, or eat. These symptoms may represent a serious problem that is an emergency. Do not wait to see if the symptoms will go away. Get medical help right away. Call your local emergency services (911 in the [place] Do not drive yourself to the hospital. Summary  If a person has gastroesophageal reflux disease (GERD), food and stomach acid move back up into the esophagus and cause symptoms or problems such as damage to the esophagus.  Treatment will depend on how bad your symptoms are.  Follow a diet as told by your doctor.  Take all medicines only as told by your doctor. This information is not intended to replace advice given to you by your health care provider. Make sure you discuss any questions you have with your health care provider. Document Revised: 10/07/2019 Document Reviewed: 10/07/2019 Elsevier Patient Education  2021 Elsevier Inc. Famotidine tablets or gelcaps What is this medicine? FAMOTIDINE (fa MOE ti deen) is a type of antihistamine that blocks the release of stomach acid. It is used to treat stomach or intestinal ulcers. It can also relieve heartburn from acid reflux. This medicine may be used for other purposes; ask your health care provider or pharmacist if you have questions. COMMON BRAND  NAME(S): Heartburn Relief, Pepcid, Pepcid AC, Pepcid AC Maximum Strength, Zantac What should I tell my health care provider before I take this medicine? They need to know if you have any of these conditions:  kidney or liver disease  trouble swallowing  an unusual or allergic reaction to famotidine, other medicines, foods, dyes, or preservatives  pregnant or trying to get pregnant  breast-feeding How should I use this medicine? Take this medicine by mouth with a glass of water. Follow the directions on the prescription label. If you only take this medicine once a day, take it at bedtime. Take your doses at regular intervals. Do not take your medicine more often than directed. Talk to your pediatrician regarding the use of this medicine in children. Special care may be needed. Overdosage: If you think you have taken too much of this medicine contact a poison control center or emergency room at once. NOTE: This medicine is only for you. Do not share this medicine with others. What if I miss a dose? If  you miss a dose, take it as soon as you can. If it is almost time for your next dose, take only that dose. Do not take double or extra doses. What may interact with this medicine?  delavirdine  itraconazole  ketoconazole This list may not describe all possible interactions. Give your health care provider a list of all the medicines, herbs, non-prescription drugs, or dietary supplements you use. Also tell them if you smoke, drink alcohol, or use illegal drugs. Some items may interact with your medicine. What should I watch for while using this medicine? Tell your doctor or health care professional if your condition does not start to get better or if it gets worse. Finish the full course of tablets prescribed, even if you feel better. Do not take with aspirin, ibuprofen or other antiinflammatory medicines. These can make your condition worse. Do not smoke cigarettes or drink alcohol. These  cause irritation in your stomach and can increase the time it will take for ulcers to heal. If you get black, tarry stools or vomit up what looks like coffee grounds, call your doctor or health care professional at once. You may have a bleeding ulcer. This medicine may cause a decrease in vitamin B12. You should make sure that you get enough vitamin B12 while you are taking this medicine. Discuss the foods you eat and the vitamins you take with your health care professional. What side effects may I notice from receiving this medicine? Side effects that you should report to your doctor or health care professional as soon as possible:  agitation, nervousness  confusion  hallucinations  skin rash, itching Side effects that usually do not require medical attention (report to your doctor or health care professional if they continue or are bothersome):  constipation  diarrhea  dizziness  headache This list may not describe all possible side effects. Call your doctor for medical advice about side effects. You may report side effects to FDA at 1-800-FDA-1088. Where should I keep my medicine? Keep out of the reach of children. Store at room temperature between 15 and 30 degrees C (59 and 86 degrees F). Do not freeze. Throw away any unused medicine after the expiration date. NOTE: This sheet is a summary. It may not cover all possible information. If you have questions about this medicine, talk to your doctor, pharmacist, or health care provider.  2021 Elsevier/Gold Standard (2016-11-11 13:17:50)

## 2020-06-27 ENCOUNTER — Encounter: Payer: Self-pay | Admitting: Family Medicine

## 2020-06-27 LAB — HEPATIC FUNCTION PANEL
ALT: 13 IU/L (ref 0–32)
AST: 20 IU/L (ref 0–40)
Albumin: 3.4 g/dL — ABNORMAL LOW (ref 3.8–4.9)
Alkaline Phosphatase: 130 IU/L — ABNORMAL HIGH (ref 44–121)
Bilirubin Total: 0.4 mg/dL (ref 0.0–1.2)
Bilirubin, Direct: 0.16 mg/dL (ref 0.00–0.40)
Total Protein: 7 g/dL (ref 6.0–8.5)

## 2020-08-28 ENCOUNTER — Other Ambulatory Visit: Payer: Self-pay

## 2020-08-28 MED FILL — Losartan Potassium Tab 50 MG: ORAL | 30 days supply | Qty: 30 | Fill #0 | Status: AC

## 2020-08-28 MED FILL — Famotidine Tab 20 MG: ORAL | 30 days supply | Qty: 60 | Fill #0 | Status: AC

## 2020-08-28 MED FILL — Naproxen Tab 500 MG: ORAL | 30 days supply | Qty: 60 | Fill #0 | Status: AC

## 2020-12-28 ENCOUNTER — Ambulatory Visit: Payer: Self-pay | Admitting: Family Medicine

## 2021-01-01 ENCOUNTER — Ambulatory Visit: Payer: Self-pay | Admitting: Family Medicine

## 2021-01-01 ENCOUNTER — Ambulatory Visit: Payer: Self-pay | Admitting: Nurse Practitioner

## 2021-03-17 LAB — HM PAP SMEAR: HPV mRNA, High Risk: NOT DETECTED

## 2021-12-28 IMAGING — DX DG KNEE COMPLETE 4+V*R*
4 series · 4 of 4 positions shown · non-contrast
Comparison: None.

CLINICAL DATA: Acute right knee pain after injury 2 days ago.

EXAM:
RIGHT KNEE - COMPLETE 4+ VIEW

[knee ap]
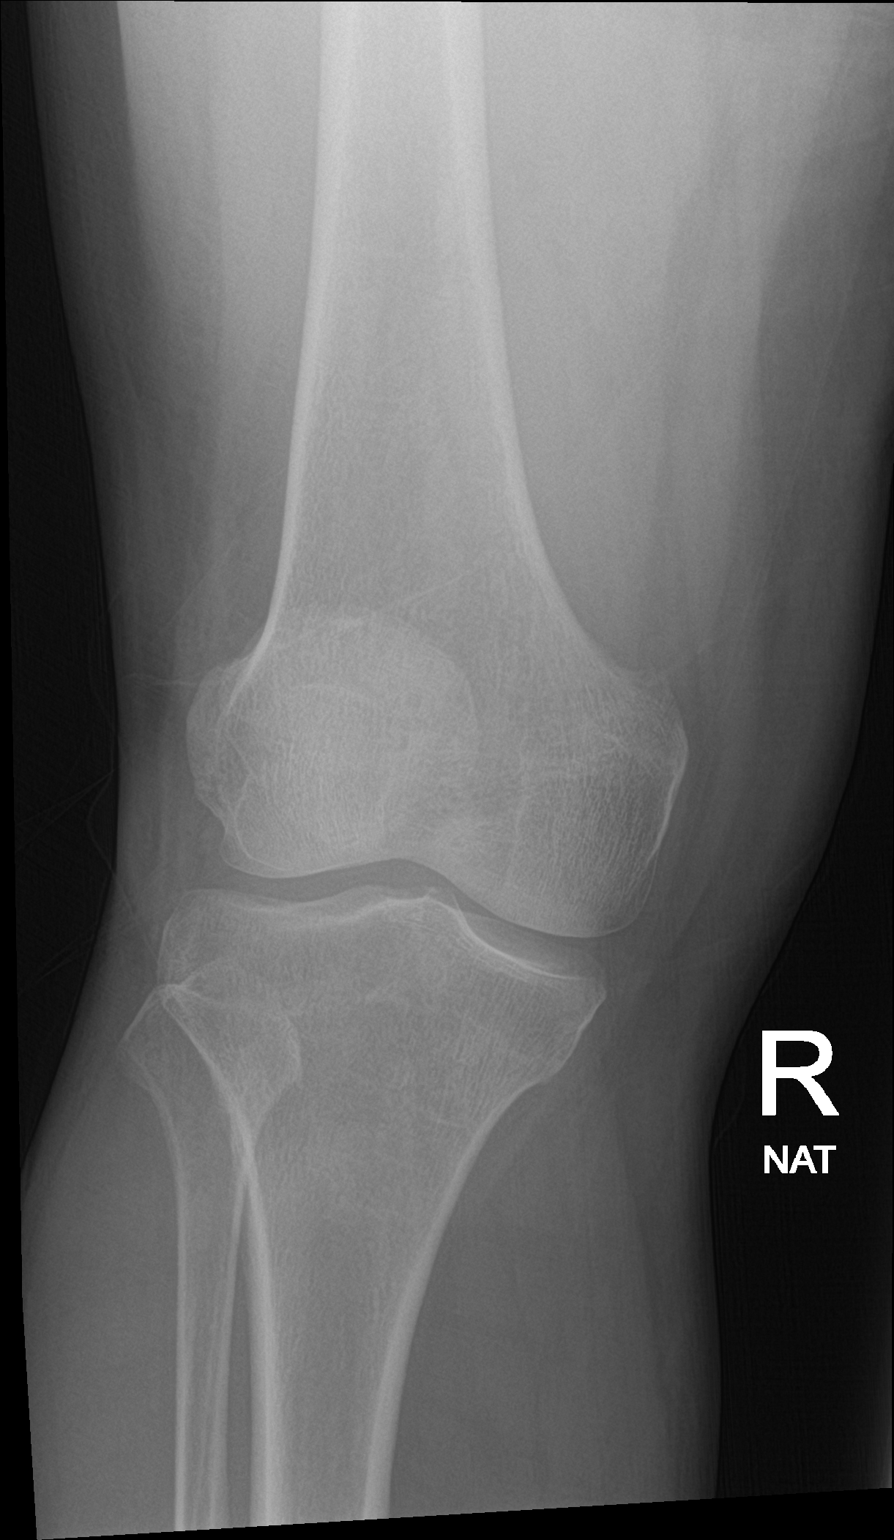

[knee obl (1 of 2)]
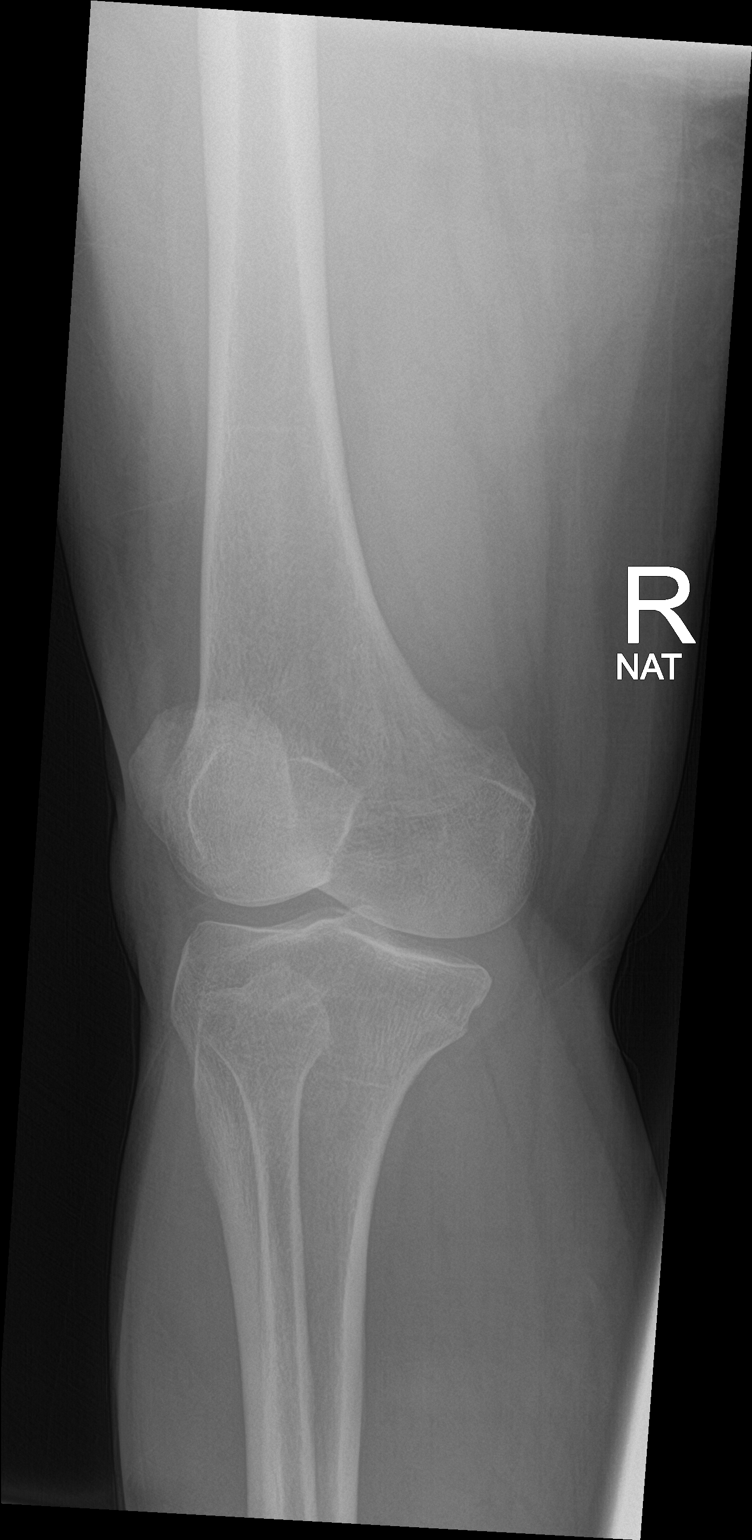

[knee obl (2 of 2)]
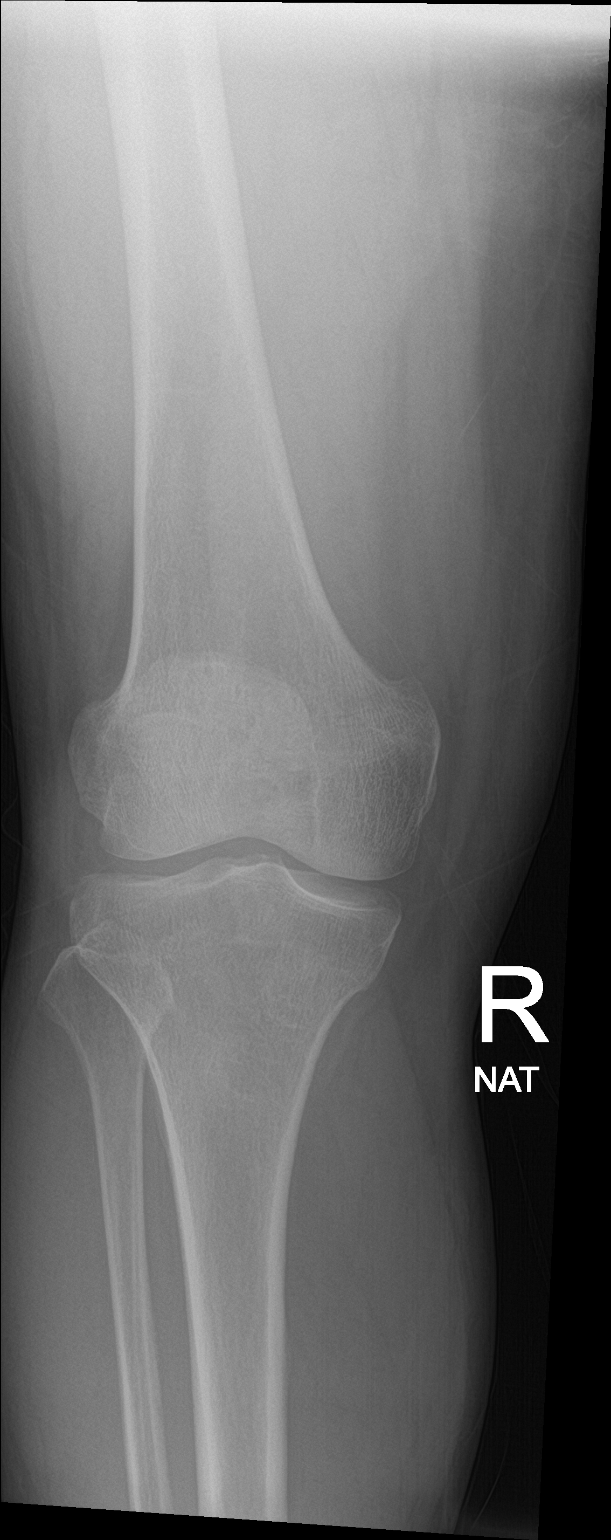

[knee lat]
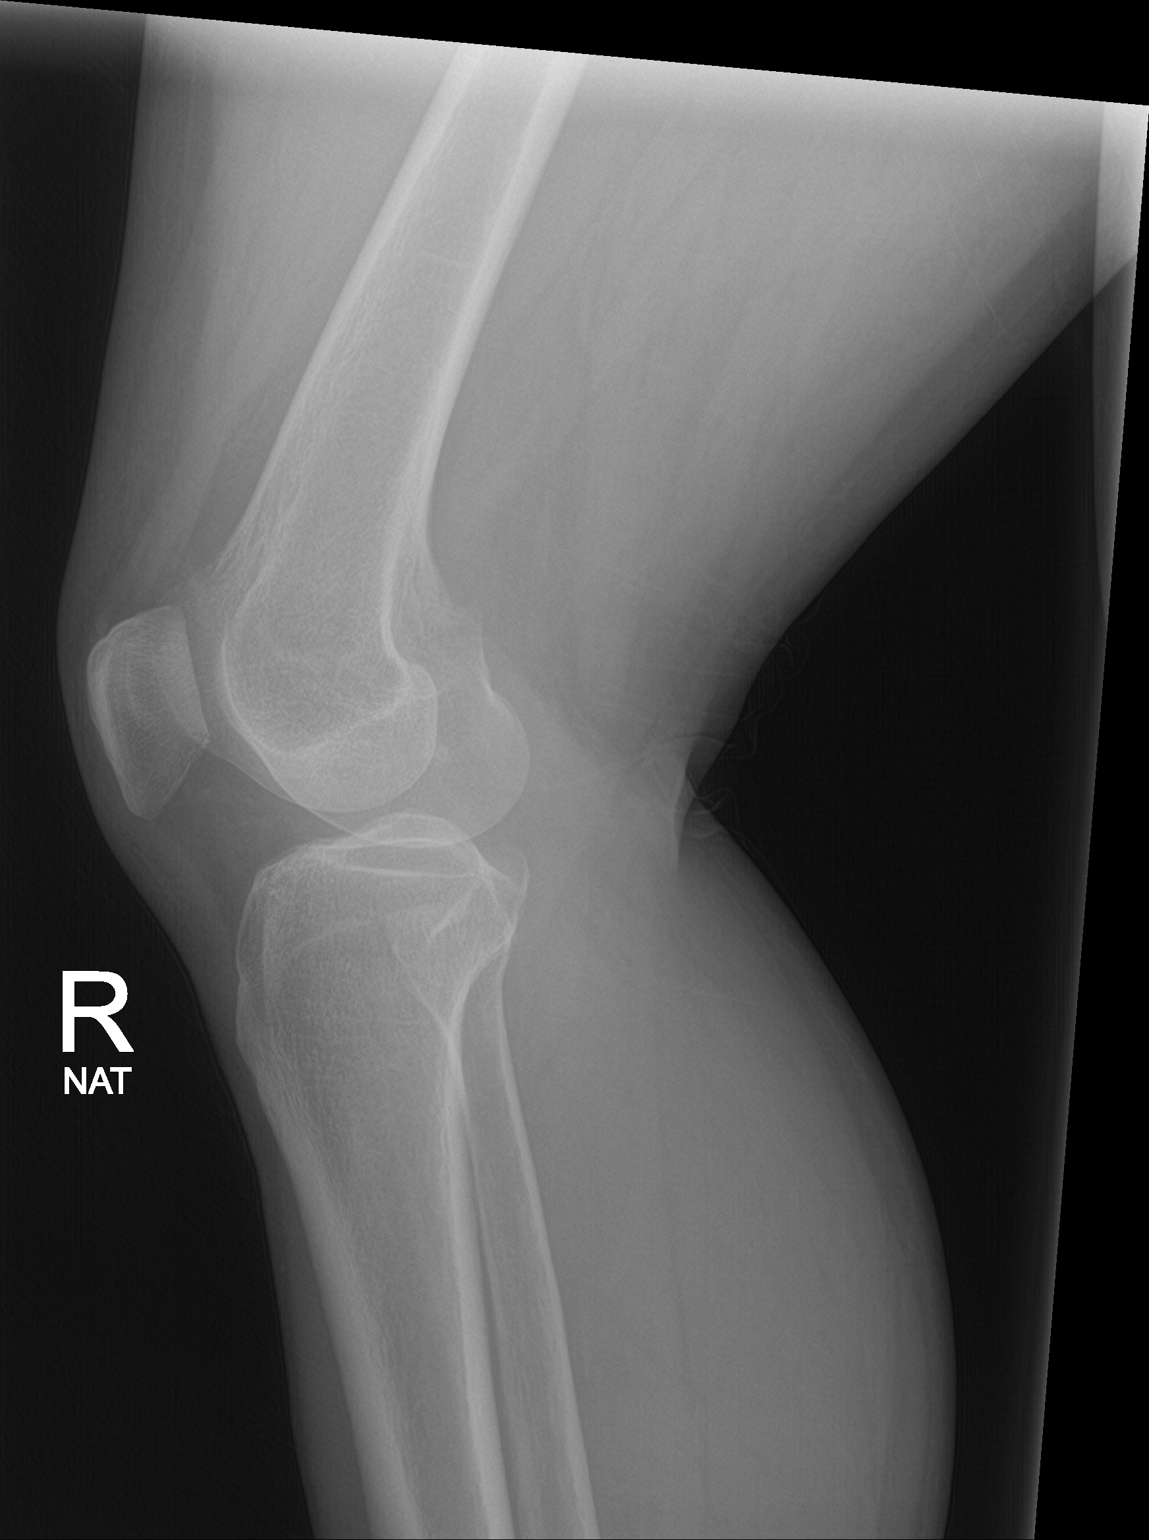

[4 of 4 positions shown; findings below may reference images not displayed]

FINDINGS: No evidence of fracture, dislocation, or joint effusion. No evidence
of arthropathy or other focal bone abnormality. Soft tissues are
unremarkable.
IMPRESSION: Negative.

## 2024-02-16 ENCOUNTER — Ambulatory Visit: Payer: Self-pay | Admitting: Student in an Organized Health Care Education/Training Program

## 2024-02-16 ENCOUNTER — Encounter: Payer: Self-pay | Admitting: Student in an Organized Health Care Education/Training Program

## 2024-02-16 VITALS — BP 149/70 | HR 84 | Resp 16 | Ht 62.0 in | Wt 159.6 lb

## 2024-02-16 DIAGNOSIS — F0670 Mild neurocognitive disorder due to known physiological condition without behavioral disturbance: Secondary | ICD-10-CM

## 2024-02-16 DIAGNOSIS — I10 Essential (primary) hypertension: Secondary | ICD-10-CM | POA: Diagnosis not present

## 2024-02-16 DIAGNOSIS — R7303 Prediabetes: Secondary | ICD-10-CM | POA: Diagnosis not present

## 2024-02-16 DIAGNOSIS — G309 Alzheimer's disease, unspecified: Secondary | ICD-10-CM | POA: Insufficient documentation

## 2024-02-16 DIAGNOSIS — E782 Mixed hyperlipidemia: Secondary | ICD-10-CM | POA: Diagnosis not present

## 2024-02-16 DIAGNOSIS — M150 Primary generalized (osteo)arthritis: Secondary | ICD-10-CM | POA: Diagnosis not present

## 2024-02-16 DIAGNOSIS — M159 Polyosteoarthritis, unspecified: Secondary | ICD-10-CM | POA: Insufficient documentation

## 2024-02-16 DIAGNOSIS — E538 Deficiency of other specified B group vitamins: Secondary | ICD-10-CM | POA: Insufficient documentation

## 2024-02-16 DIAGNOSIS — G629 Polyneuropathy, unspecified: Secondary | ICD-10-CM | POA: Insufficient documentation

## 2024-02-16 DIAGNOSIS — F0280 Dementia in other diseases classified elsewhere without behavioral disturbance: Secondary | ICD-10-CM | POA: Insufficient documentation

## 2024-02-16 DIAGNOSIS — E785 Hyperlipidemia, unspecified: Secondary | ICD-10-CM | POA: Insufficient documentation

## 2024-02-16 MED ORDER — LOSARTAN POTASSIUM-HCTZ 50-12.5 MG PO TABS: 1.0000 | ORAL_TABLET | Freq: Every day | ORAL | 2 refills | Status: DC

## 2024-02-16 MED ORDER — DONEPEZIL HCL 5 MG PO TABS: 5.0000 mg | ORAL_TABLET | Freq: Every day | ORAL | 2 refills | Status: DC

## 2024-02-16 MED ORDER — ROSUVASTATIN CALCIUM 10 MG PO TABS: 10.0000 mg | ORAL_TABLET | Freq: Every day | ORAL | 3 refills | Status: AC

## 2024-02-16 MED ORDER — VITAMIN B-12 1000 MCG PO TABS: 1000.0000 ug | ORAL_TABLET | Freq: Every day | ORAL | 2 refills | Status: AC

## 2024-02-16 MED ORDER — DULOXETINE HCL 30 MG PO CPEP: 30.0000 mg | ORAL_CAPSULE | Freq: Every day | ORAL | 2 refills | Status: AC

## 2024-02-16 NOTE — Assessment & Plan Note (Signed)
 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  No easy solution to this discomforts.  We talked about improving function.  Will prescribe duloxetine to help with chronic discomfort.  I recommended against daily use of NSAIDs.

## 2024-02-16 NOTE — Assessment & Plan Note (Signed)
 Patient describes a burning type of sensation in her extremities.  It sounds consistent with neuropathy.  Prior physician prescribed gabapentin.  No clear etiology of the neuropathy.  Will check A1c, but distribution is not classic stocking glove.  Will also check for paraprotein, TSH, and B12 levels.

## 2024-02-16 NOTE — Assessment & Plan Note (Signed)
 History of hyperlipidemia previously treated with atorvastatin .  No history of ischemic events that I can find.  Will switch to rosuvastatin because she is having diffuse discomfort in her joints, and I wonder if some of it may be myalgias.

## 2024-02-16 NOTE — Assessment & Plan Note (Signed)
 Blood pressure currently 149/70 consistent with stage II hypertension.  Patient has a mild degree of frailty.  Previously using losartan  and hydrochlorothiazide  but off medications now.  Will start with low-dose Hyzaar 50 mg daily and close follow-up in 1 month.  Check kidney function today.

## 2024-02-16 NOTE — Assessment & Plan Note (Signed)
 History of prediabetes with last A1c of 6% in 2022.  Will check A1c today.

## 2024-02-16 NOTE — Assessment & Plan Note (Signed)
 Symptoms and collateral from her family are most consistent with a mild neurocognitive disorder.  Based on the exam, I do not see any signs of parkinsonism.  No history of vascular events.  No hallucinations or behavior disturbance.  I think this is most likely Alzheimer's disease that has been progressive in the last 2 years.  She is having some difficulty with activities of daily living including cooking.  Some safety concerns about leaving the stove on or leaving faucets on.  She has support from her family members, generally somebody is at home with her 24 hours.  She has isolation because of her memory and language barrier.  I recommended a trial of donepezil, and counseled on very reasonable expectations.

## 2024-02-16 NOTE — Assessment & Plan Note (Signed)
 Currently using oral B12 supplementation 1000 mcg daily.  Still has the bottle.  Will check B12 levels today along with CBC.

## 2024-02-16 NOTE — Progress Notes (Signed)
 New Patient Office Visit  Patient ID: Michele Meza, Female   DOB: Jul 05, 1960 64 y.o. MRN: [id number]  Chief Complaint  Patient presents with   New Patient (Initial Visit)    Patient presents today for a new patient appointment.   Subjective:     Michele Meza presents to establish care  HPI  Discussed the use of AI scribe software for clinical note transcription with the patient, who gave verbal consent to proceed.  History of Present Illness Michele Meza is a 62 year old female who presents for a regular checkup after moving from [place]  to [place] .  She recently moved from [place]  to [place]  and previously received care in [place] , where she had a caregiver. She currently lacks caregiver support in [place] , which has led to difficulty managing her medications and performing household chores.  She experiences significant pain, particularly on her left side, which impairs her ability to shower and perform daily activities. Knee pain sometimes disrupts her sleep, causing insomnia. She describes a burning and shooting pain in her leg, specifically on the bottom of her foot. She is currently taking gabapentin and vitamin B12 supplements.  Her son notes memory issues, such as forgetting to turn off the faucet or stove. She lives with her son and daughter-in-law, who assist her when available, but she is often home alone during the day while her son works night shifts and sleeps during the day.  She has quit drinking wine and now consumes juice and water. She has two young grandchildren, aged four years and one and a half years, whom she watches but cannot physically assist due to her pain.   Outpatient Encounter Medications as of 02/16/2024  Medication Sig   donepezil (ARICEPT) 5 MG tablet Take 1 tablet (5 mg total) by mouth at bedtime.   DULoxetine (CYMBALTA) 30 MG capsule Take 1 capsule (30 mg total) by mouth daily.   losartan -hydrochlorothiazide   (HYZAAR) 50-12.5 MG tablet Take 1 tablet by mouth daily.   rosuvastatin (CRESTOR) 10 MG tablet Take 1 tablet (10 mg total) by mouth daily.   [DISCONTINUED] acetaminophen (TYLENOL) 500 MG tablet Take 500 mg by mouth every 8 (eight) hours as needed for mild pain (pain score 1-3).   [DISCONTINUED] atorvastatin  (LIPITOR) 20 MG tablet Take 1 tablet (20 mg total) by mouth daily.   [DISCONTINUED] cyanocobalamin (VITAMIN B12) 1000 MCG tablet Take 1,000 mcg by mouth daily.   [DISCONTINUED] folic acid (FOLVITE) 1 MG tablet Take 1 mg by mouth daily.   [DISCONTINUED] losartan  (COZAAR ) 25 MG tablet Take 25 mg by mouth daily.   [DISCONTINUED] meclizine  (ANTIVERT ) 25 MG tablet Take 1 tablet (25 mg total) by mouth 3 (three) times daily as needed for dizziness.   [DISCONTINUED] pantoprazole (PROTONIX) 40 MG tablet Take 40 mg by mouth daily.   [DISCONTINUED] piroxicam (FELDENE) 20 MG capsule Take 20 mg by mouth daily.   [DISCONTINUED] traZODone (DESYREL) 100 MG tablet Take 100 mg by mouth at bedtime.   [DISCONTINUED] triamcinolone  cream (KENALOG ) 0.1 % Apply 1 Application topically 2 (two) times daily.   cyanocobalamin (VITAMIN B12) 1000 MCG tablet Take 1 tablet (1,000 mcg total) by mouth daily.   [DISCONTINUED] famotidine  (PEPCID ) 20 MG tablet Take 1 tablet (20 mg total) by mouth 2 (two) times daily. (Patient not taking: Reported on 02/16/2024)   [DISCONTINUED] famotidine  (PEPCID ) 20 MG tablet TAKE 1 TABLET (20 MG TOTAL) BY MOUTH 2 (TWO) TIMES DAILY.   [DISCONTINUED] hydrochlorothiazide  (HYDRODIURIL ) 25 MG tablet Take one  tablet daily.   [DISCONTINUED] losartan  (COZAAR ) 50 MG tablet Take 1 tablet (50 mg total) by mouth daily.   [DISCONTINUED] losartan  (COZAAR ) 50 MG tablet TAKE 1 TABLET BY MOUTH DAILY.   [DISCONTINUED] naproxen  (NAPROSYN ) 500 MG tablet Take 1 tablet (500 mg total) by mouth 2 (two) times daily with a meal.   No facility-administered encounter medications on file as of 02/16/2024.    Past Medical  History:  Diagnosis Date   Chronic knee pain    Dizziness 05/2020   Headache 05/2020   History of recent fall 05/2020   Hypertension    Vitamin D  deficiency 05/2020      Objective:    BP (!) 149/70   Pulse 84   Resp 16   Ht 5' 2 (1.575 m)   Wt 159 lb 9.6 oz (72.4 kg)   LMP 05/10/2011   SpO2 99%   BMI 29.19 kg/m   Physical Exam  Gen: Mildly frail appearing older woman Eyes: Normal Ears: Moderately hard of hearing, normal ear canals and tympanic membranes bilaterally Neck: Normal thyroid, no nodules or adenopathy Heart: Regular, no murmur Lungs: Unlabored, clear throughout Abd: Soft, nontender Ext: Warm, no edema MSK: Bilateral shoulder impingement, can abduct to about 90 degrees.  No joint effusions or synovitis.  Moderate osteoarthritis in bilateral hands with [place] nodules. Neuro: Alert, conversational, moderately decreased up and go full strength upper and lower extremities, normal gait and balance Psych: moderately depressed appearing     Assessment & Plan:   Problem List Items Addressed This Visit       High   Essential hypertension - Primary (Chronic)   Blood pressure currently 149/70 consistent with stage II hypertension.  Patient has a mild degree of frailty.  Previously using losartan  and hydrochlorothiazide  but off medications now.  Will start with low-dose Hyzaar 50 mg daily and close follow-up in 1 month.  Check kidney function today.      Relevant Medications   losartan -hydrochlorothiazide  (HYZAAR) 50-12.5 MG tablet   rosuvastatin (CRESTOR) 10 MG tablet   Other Relevant Orders   Comprehensive metabolic panel with GFR   Osteoarthritis, multiple sites (Chronic)   Patient reports diffuse discomfort in multiple joints, exam consistent with moderate arthritis in the left shoulder, left knee, right knee, and bilateral hands.  No easy solution to this discomforts.  We talked about improving function.  Will prescribe duloxetine to help with chronic  discomfort.  I recommended against daily use of NSAIDs.      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  Based on the exam, I do not see any signs of parkinsonism.  No history of vascular events.  No hallucinations or behavior disturbance.  I think this is most likely Alzheimer's disease that has been progressive in the last 2 years.  She is having some difficulty with activities of daily living including cooking.  Some safety concerns about leaving the stove on or leaving faucets on.  She has support from her family members, generally somebody is at home with her 24 hours.  She has isolation because of her memory and language barrier.  I recommended a trial of donepezil, and counseled on very reasonable expectations.      Relevant Medications   DULoxetine (CYMBALTA) 30 MG capsule   donepezil (ARICEPT) 5 MG tablet  Medium    Prediabetes (Chronic)   History of prediabetes with last A1c of 6% in 2022.  Will check A1c today.      Relevant Orders   Hemoglobin A1c   Hyperlipidemia (Chronic)   History of hyperlipidemia previously treated with atorvastatin .  No history of ischemic events that I can find.  Will switch to rosuvastatin because she is having diffuse discomfort in her joints, and I wonder if some of it may be myalgias.      Relevant Medications   losartan -hydrochlorothiazide  (HYZAAR) 50-12.5 MG tablet   rosuvastatin (CRESTOR) 10 MG tablet   Other Relevant Orders   Lipid panel     Low   Neuropathy (Chronic)   Patient describes a burning type of sensation in her extremities.  It sounds consistent with neuropathy.  Prior physician prescribed gabapentin.  No clear etiology of the neuropathy.  Will check A1c, but distribution is not classic stocking glove.  Will also check for paraprotein, TSH, and  B12 levels.      Relevant Medications   DULoxetine (CYMBALTA) 30 MG capsule   Other Relevant Orders   Comprehensive metabolic panel with GFR   TSH   Immunofixation electrophoresis   Kappa/lambda light chains   B12 deficiency (Chronic)   Currently using oral B12 supplementation 1000 mcg daily.  Still has the bottle.  Will check B12 levels today along with CBC.      Relevant Orders   CBC   Vitamin B12    Return in about 4 weeks (around 03/15/2024).   Cleatus Debby Specking, MD [facility]

## 2024-02-16 NOTE — Patient Instructions (Signed)
  VISIT SUMMARY: Today, you came in for a regular checkup after moving from [place]  to [place] . We discussed your general health, medication management, and several specific health concerns, including chronic pain, peripheral neuropathy, hypertension, memory issues, and difficulty with daily activities.  YOUR PLAN: -ADULT WELLNESS VISIT: This visit focused on your overall health and managing your medications. We ordered blood work and provided you with a new list of medications.  -CHRONIC PAIN OF LEFT UPPER EXTREMITY AND LEFT KNEE: Chronic pain is long-lasting pain that can affect your daily activities. We will continue your current medication, gabapentin, to help manage this pain.  -PERIPHERAL NEUROPATHY OF LOWER EXTREMITY: Peripheral neuropathy is a condition that causes pain, burning, and shooting sensations in your legs and feet. We will continue your gabapentin to help manage these symptoms.  -HYPERTENSION: Hypertension is high blood pressure, which can lead to other health problems if not managed. We discussed your treatment and prescribed a new medication to help control your blood pressure.  -MEMORY IMPAIRMENT: Memory impairment means having trouble remembering things, which can affect your daily life. We talked about the need for caregiver support to help you with these issues.  -DIFFICULTY WITH ACTIVITIES OF DAILY LIVING: You are having trouble managing your medications and household chores. We discussed the need for caregiver support to assist you with these activities.  INSTRUCTIONS: Please follow up with the blood work we ordered and take your medications as prescribed. Consider arranging for caregiver support to help with your daily activities and memory issues. If you have any new symptoms or concerns, please contact our office.

## 2024-02-19 ENCOUNTER — Ambulatory Visit: Payer: Self-pay | Admitting: Student in an Organized Health Care Education/Training Program

## 2024-02-20 LAB — COMPREHENSIVE METABOLIC PANEL WITH GFR
AG Ratio: 0.9 (calc) — ABNORMAL LOW (ref 1.0–2.5)
ALT: 9 U/L (ref 6–29)
AST: 15 U/L (ref 10–35)
Albumin: 3.4 g/dL — ABNORMAL LOW (ref 3.6–5.1)
Alkaline phosphatase (APISO): 249 U/L — ABNORMAL HIGH (ref 37–153)
BUN: 15 mg/dL (ref 7–25)
CO2: 29 mmol/L (ref 20–32)
Calcium: 8.9 mg/dL (ref 8.6–10.4)
Chloride: 103 mmol/L (ref 98–110)
Creat: 0.7 mg/dL (ref 0.50–1.05)
Globulin: 3.9 g/dL — ABNORMAL HIGH (ref 1.9–3.7)
Glucose, Bld: 214 mg/dL — ABNORMAL HIGH (ref 65–99)
Potassium: 4.2 mmol/L (ref 3.5–5.3)
Sodium: 137 mmol/L (ref 135–146)
Total Bilirubin: 0.5 mg/dL (ref 0.2–1.2)
Total Protein: 7.3 g/dL (ref 6.1–8.1)
eGFR: 98 mL/min/1.73m2 (ref >=60)

## 2024-02-20 LAB — CBC
HCT: 31.5 % — ABNORMAL LOW (ref 35.0–45.0)
Hemoglobin: 9.9 g/dL — ABNORMAL LOW (ref 11.7–15.5)
MCH: 25.6 pg — ABNORMAL LOW (ref 27.0–33.0)
MCHC: 31.4 g/dL — ABNORMAL LOW (ref 32.0–36.0)
MCV: 81.4 fL (ref 80.0–100.0)
MPV: 10.5 fL (ref 7.5–12.5)
Platelets: 173 Thousand/uL (ref 140–400)
RBC: 3.87 Million/uL (ref 3.80–5.10)
RDW: 13.8 % (ref 11.0–15.0)
WBC: 3.8 Thousand/uL (ref 3.8–10.8)

## 2024-02-20 LAB — KAPPA/LAMBDA LIGHT CHAINS
Kappa free light chain: 58.9 mg/L — ABNORMAL HIGH (ref 3.3–19.4)
Kappa:Lambda Ratio: 1.22 (ref 0.26–1.65)
Lambda Free Lght Chn: 48.3 mg/L — ABNORMAL HIGH (ref 5.7–26.3)

## 2024-02-20 LAB — LIPID PANEL
Cholesterol: 129 mg/dL (ref <200)
HDL: 44 mg/dL — ABNORMAL LOW (ref >=50)
LDL Cholesterol (Calc): 61 mg/dL
Non-HDL Cholesterol (Calc): 85 mg/dL (ref <130)
Total CHOL/HDL Ratio: 2.9 (calc) (ref <5.0)
Triglycerides: 161 mg/dL — ABNORMAL HIGH (ref <150)

## 2024-02-20 LAB — HEMOGLOBIN A1C
Hgb A1c MFr Bld: 6.3 % — ABNORMAL HIGH (ref <5.7)
Mean Plasma Glucose: 134 mg/dL
eAG (mmol/L): 7.4 mmol/L

## 2024-02-20 LAB — IMMUNOFIXATION ELECTROPHORESIS
IgM, Serum: 144 mg/dL (ref 50–300)
IgM, Serum: 2302 mg/dL — AB (ref 600–300)
Immunoglobulin A: 2302 mg/dL — ABNORMAL HIGH (ref 70–320)
Immunoglobulin A: 681 mg/dL — AB (ref 70–320)

## 2024-02-20 LAB — TSH: TSH: 1.24 m[IU]/L (ref 0.40–4.50)

## 2024-02-20 LAB — VITAMIN B12: Vitamin B-12: 1889 pg/mL — ABNORMAL HIGH (ref 200–1100)

## 2024-02-23 ENCOUNTER — Telehealth: Payer: Self-pay

## 2024-02-23 DIAGNOSIS — Z0279 Encounter for issue of other medical certificate: Secondary | ICD-10-CM

## 2024-02-23 NOTE — Telephone Encounter (Signed)
 Placed on your desk for review and signature

## 2024-02-23 NOTE — Telephone Encounter (Signed)
 Type of form received: request for independent assessment for personal care   Additional comments:   Received by: patient family  Form should be Faxed/mailed to: [phone]  Is patient requesting call for pickup: no  Form placed:  in PCP pick up folder at front desk   Attach charge sheet.  Provider will determine charge.  Individual made aware of 3-5 business day turn around No?

## 2024-02-26 NOTE — Telephone Encounter (Signed)
 Faxed to requested number on the form, placed in faxed folder at front desk

## 2024-02-26 NOTE — Telephone Encounter (Signed)
 Completed and placed in MA basket.  Thank you.

## 2024-02-29 ENCOUNTER — Ambulatory Visit: Admitting: Student in an Organized Health Care Education/Training Program

## 2024-02-29 ENCOUNTER — Telehealth: Payer: Self-pay | Admitting: Student in an Organized Health Care Education/Training Program

## 2024-02-29 NOTE — Telephone Encounter (Signed)
 Patient has been experiencing dizziness, nausea, vomiting and abdominal pain that is not due to constipation. Issues are not constant, they come and go. Patient is wondering if this could be side effects from one of the medications she recently started on 02/16/24. Patient's daughter would like a call back due to language barrier, [phone]

## 2024-03-01 ENCOUNTER — Ambulatory Visit: Admitting: Family Medicine

## 2024-03-01 ENCOUNTER — Encounter: Payer: Self-pay | Admitting: Student in an Organized Health Care Education/Training Program

## 2024-03-01 VITALS — BP 99/47 | HR 74 | Wt 155.0 lb

## 2024-03-01 DIAGNOSIS — I10 Essential (primary) hypertension: Secondary | ICD-10-CM

## 2024-03-01 DIAGNOSIS — D649 Anemia, unspecified: Secondary | ICD-10-CM | POA: Diagnosis not present

## 2024-03-01 DIAGNOSIS — K769 Liver disease, unspecified: Secondary | ICD-10-CM | POA: Diagnosis not present

## 2024-03-01 DIAGNOSIS — G309 Alzheimer's disease, unspecified: Secondary | ICD-10-CM | POA: Diagnosis not present

## 2024-03-01 DIAGNOSIS — F0670 Mild neurocognitive disorder due to known physiological condition without behavioral disturbance: Secondary | ICD-10-CM

## 2024-03-01 NOTE — Assessment & Plan Note (Signed)
 Chronic dementia due to Alzheimer's disease has been progressive.  She is requiring full support from family at this point.  We tried to initiate donepezil  2 weeks ago.  She has had recurrence of issues with nausea and vomiting.  This could be a side effect of the donepezil  and I recommended discontinuing.  I recently completed paperwork for personal care services, hopefully we can get her some more support at home.

## 2024-03-01 NOTE — Patient Instructions (Signed)
  VISIT SUMMARY: During your visit, we discussed your worsening dizziness and blurred vision, along with associated symptoms like nausea and vomiting. We also reviewed your blood pressure, osteoarthritis, mild neurocognitive disorder, and a past concern about your liver health.  YOUR PLAN: -DIZZINESS WITH NAUSEA AND VOMITING: Your chronic dizziness with nausea and vomiting has worsened. We have stopped methocarbamol as it is no longer effective and may increase your risk of falling. We also stopped donepezil , which might be causing your nausea and vomiting.  -HYPERTENSION: Your blood pressure is currently low, likely due to your blood pressure medication. We have temporarily stopped this medication and ordered blood work to check your blood counts and other important health markers.  -OSTEOARTHRITIS, MULTIPLE SITES: Your chronic osteoarthritis continues to be managed as before. Osteoarthritis is a condition that causes joint pain and stiffness.  -MILD NEUROCOGNITIVE DISORDER DUE TO ALZHEIMER DISEASE: You have mild neurocognitive disorder, which causes forgetfulness and memory issues. We have stopped donepezil  as it may be contributing to your nausea and vomiting.  -SUSPECTED LIVER NEOPLASM (PENDING RECORDS): There is a past concern about a possible liver tumor. We need to get your medical records from [place]  to evaluate this further. You have signed a release form to retrieve these records.  INSTRUCTIONS: Please follow up with the blood work as ordered. We will also need to review your medical records from [place]  once they are available. Continue to monitor your symptoms and report any changes or new symptoms to us .

## 2024-03-01 NOTE — Progress Notes (Signed)
 Acute Office Visit  Patient ID: Michele Meza, female    DOB: [birth year], 63 y.o.   MRN: [id number]  PCP: Jerrell Cleatus Ned, MD  Chief Complaint  Patient presents with   Dizziness    Subjective:     HPI  Discussed the use of AI scribe software for clinical note transcription with the patient, who gave verbal consent to proceed.  History of Present Illness Michele Meza is a 63 year old female who presents with worsening dizziness and blurred vision.  She has been experiencing dizziness and blurred vision for over a year, with symptoms worsening recently. Initially, her symptoms improved with medication, but the relief was temporary, lasting only two to three months. She has also experienced episodes of almost falling twice recently.  In addition to dizziness, she has been experiencing nausea and vomiting, which occur concurrently with her dizziness. These symptoms have been ongoing for years. She was previously taking methocarbamol 25 mg for dizziness, but it is no longer effective.  Her blood pressure was recorded at 149/70 mmHg two weeks ago, and adjustments were made to her medications at that time. She was started on a new blood pressure medication, a medication for aches and pains, and a memory medication. She denies taking any medications from her previous doctor in [place] .  There is a concern regarding her liver health. Her brother mentioned a past issue with her liver, potentially related to a suspected cancer diagnosis, which was being evaluated for surgery in [place] . However, she moved to live with her son and did not proceed with the surgery. Her liver enzymes were checked two weeks ago and were normal. There is no current documentation of liver problems in her records, but her brother recalls hospitalizations in [place]  where liver issues were diagnosed.  She has a history of dementia, as evidenced by forgetfulness in daily activities, such as forgetting to  take soup with her meal. She has been hospitalized twice in the past due to falls, which may be related to her cognitive decline. She previously received Medicaid assistance while living in [place] .      Objective:    BP (!) 99/47   Pulse 74   Wt 155 lb (70.3 kg)   LMP 05/10/2011   SpO2 99%   BMI 28.35 kg/m    Physical Exam  Gen: Well-appearing woman Neuro: Alert, conversational, full strength upper and lower extremities, normal get up and go, normal gait and balance, no ataxia, normal cranial nerves Heart: Regular, no murmur Lungs: Unlabored, clear throughout Abd; soft and nontender, no organomegaly Ext: Warm, no edema     Assessment & Plan:   Problem List Items Addressed This Visit       High   Essential hypertension - Primary (Chronic)   History of chronic hypertension, we started Hyzaar 2 weeks ago and her blood pressures have become too low.  She is having some symptoms that could be orthostatic changes.  Little hard to tell because of her memory issues.  But she has high frailty, and based on the low blood pressures in our office, we will discontinue Hyzaar and hold further blood pressure medications for now.  Given her dementia, there is likely very little benefit to tight blood pressure control in the future.  Will tolerate higher blood pressures for now.      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  She is requiring full support from family at this point.  We tried to initiate donepezil  2 weeks ago.  She has had recurrence of issues with nausea and vomiting.  This could be a side effect of the donepezil  and I recommended discontinuing.  I recently completed paperwork for personal care services, hopefully we can get her some more support at home.      Liver disease   Patient's daughter reports a curious  history of having some type of liver disease diagnosed at a hospital in [place] , she reports that they were planning on some type of surgery to remove these structural lesions.  Does not have much more history than that.  I asked her to find out the name of the hospital so we can request records.  Liver enzymes were normal last we checked, and she shows no signs of cirrhosis on exam.  I wonder about risk of HCC.  Will check an AFP tumor marker today.  Based on the prior imaging, we will determine if we need to repeat imaging.        Relevant Orders   CBC   Comprehensive metabolic panel with GFR   AFP tumor marker   Normocytic anemia   Normocytic anemia noted on labs.  Would like to recheck the CBC.  Check iron levels to look for iron deficiency as the culprit.  Renal function is reasonable, could be anemia of chronic disease.       Relevant Orders   Iron, TIBC and Ferritin Panel   Cleatus Debby Specking, MD [facility] at [facility]

## 2024-03-01 NOTE — Telephone Encounter (Signed)
 Called with interpreter to relay message below and left Vm to return call.

## 2024-03-01 NOTE — Assessment & Plan Note (Signed)
 Normocytic anemia noted on labs.  Would like to recheck the CBC.  Check iron levels to look for iron deficiency as the culprit.  Renal function is reasonable, could be anemia of chronic disease.

## 2024-03-01 NOTE — Assessment & Plan Note (Signed)
 Patient's daughter reports a curious history of having some type of liver disease diagnosed at a hospital in [place] , she reports that they were planning on some type of surgery to remove these structural lesions.  Does not have much more history than that.  I asked her to find out the name of the hospital so we can request records.  Liver enzymes were normal last we checked, and she shows no signs of cirrhosis on exam.  I wonder about risk of HCC.  Will check an AFP tumor marker today.  Based on the prior imaging, we will determine if we need to repeat imaging.

## 2024-03-01 NOTE — Assessment & Plan Note (Signed)
 History of chronic hypertension, we started Hyzaar 2 weeks ago and her blood pressures have become too low.  She is having some symptoms that could be orthostatic changes.  Little hard to tell because of her memory issues.  But she has high frailty, and based on the low blood pressures in our office, we will discontinue Hyzaar and hold further blood pressure medications for now.  Given her dementia, there is likely very little benefit to tight blood pressure control in the future.  Will tolerate higher blood pressures for now.

## 2024-03-01 NOTE — Telephone Encounter (Signed)
 I tried to call the patient's daughter, left a voicemail.  Based on the description from the message, certainly could be a side effect from a recent medication change.  Probably most likely culprit would be the donepezil .  I would recommend discontinuing that medication.  I also suspect there may be issues with other medications.  We made a number of medication changes at our last visit and I am a little worried she may be mixing those medications with prior prescriptions from her last physician.  If we get a callback, please double check the medication list from my last visit with the pills that she is actually taking.  Most effective way for me to evaluate the symptoms would be with an office visit if patient is able.  Can double book one of my spots today if needed.

## 2024-03-01 NOTE — Telephone Encounter (Signed)
Pt here today

## 2024-03-04 ENCOUNTER — Ambulatory Visit: Payer: Self-pay | Admitting: Student in an Organized Health Care Education/Training Program

## 2024-03-04 LAB — CBC
HCT: 33 % — ABNORMAL LOW (ref 35.0–45.0)
Hemoglobin: 10.3 g/dL — ABNORMAL LOW (ref 11.7–15.5)
MCH: 25.1 pg — ABNORMAL LOW (ref 27.0–33.0)
MCHC: 31.2 g/dL — ABNORMAL LOW (ref 32.0–36.0)
MCV: 80.5 fL (ref 80.0–100.0)
MPV: 10.4 fL (ref 7.5–12.5)
Platelets: 169 Thousand/uL (ref 140–400)
RBC: 4.1 Million/uL (ref 3.80–5.10)
RDW: 14.4 % (ref 11.0–15.0)
WBC: 3.4 Thousand/uL — ABNORMAL LOW (ref 3.8–10.8)

## 2024-03-04 LAB — IRON,TIBC AND FERRITIN PANEL
%SAT: 33 % (ref 16–45)
Ferritin: 23 ng/mL (ref 16–288)
Iron: 142 ug/dL (ref 45–160)
TIBC: 430 ug/dL (ref 250–450)

## 2024-03-04 LAB — COMPREHENSIVE METABOLIC PANEL WITH GFR
AG Ratio: 0.9 (calc) — ABNORMAL LOW (ref 1.0–2.5)
ALT: 9 U/L (ref 6–29)
AST: 15 U/L (ref 10–35)
Albumin: 3.5 g/dL — ABNORMAL LOW (ref 3.6–5.1)
Alkaline phosphatase (APISO): 187 U/L — ABNORMAL HIGH (ref 37–153)
BUN/Creatinine Ratio: 27 (calc) — ABNORMAL HIGH (ref 6–22)
BUN: 27 mg/dL — ABNORMAL HIGH (ref 7–25)
CO2: 25 mmol/L (ref 20–32)
Calcium: 9.4 mg/dL (ref 8.6–10.4)
Chloride: 102 mmol/L (ref 98–110)
Creat: 0.99 mg/dL (ref 0.50–1.05)
Globulin: 4 g/dL — ABNORMAL HIGH (ref 1.9–3.7)
Glucose, Bld: 230 mg/dL — ABNORMAL HIGH (ref 65–99)
Potassium: 4.1 mmol/L (ref 3.5–5.3)
Sodium: 134 mmol/L — ABNORMAL LOW (ref 135–146)
Total Bilirubin: 0.8 mg/dL (ref 0.2–1.2)
Total Protein: 7.5 g/dL (ref 6.1–8.1)
eGFR: 65 mL/min/1.73m2 (ref >=60)

## 2024-03-04 LAB — AFP TUMOR MARKER: AFP-Tumor Marker: 5.9 ng/mL

## 2024-03-05 ENCOUNTER — Telehealth: Payer: Self-pay

## 2024-03-05 NOTE — Telephone Encounter (Signed)
 Daughter stopped by and provided previous OB/GYN information, [facility] [address] [place], [place] [address] [phone] F: [phone]  Sent request for last pap smear/ other pertinent records

## 2024-03-15 ENCOUNTER — Encounter: Payer: Self-pay | Admitting: Student in an Organized Health Care Education/Training Program

## 2024-03-15 ENCOUNTER — Ambulatory Visit: Admitting: Student in an Organized Health Care Education/Training Program

## 2024-03-15 VITALS — BP 127/63 | HR 86 | Wt 156.0 lb

## 2024-03-15 DIAGNOSIS — I10 Essential (primary) hypertension: Secondary | ICD-10-CM | POA: Diagnosis not present

## 2024-03-15 DIAGNOSIS — K769 Liver disease, unspecified: Secondary | ICD-10-CM

## 2024-03-15 DIAGNOSIS — F0280 Dementia in other diseases classified elsewhere without behavioral disturbance: Secondary | ICD-10-CM

## 2024-03-15 DIAGNOSIS — G309 Alzheimer's disease, unspecified: Secondary | ICD-10-CM | POA: Diagnosis not present

## 2024-03-15 NOTE — Patient Instructions (Signed)
  VISIT SUMMARY: During your visit, we discussed your concerns about memory issues and liver health. We also reviewed your current medications and their effects on your overall health.  YOUR PLAN: -MILD NEUROCOGNITIVE DISORDER DUE TO ALZHEIMER DISEASE: This condition involves a decline in memory and cognitive function. Since previous medications were not well tolerated and there is no treatment to reverse the decline, the focus will be on ensuring your safety and comfort at home. Your family is arranging personal care services to assist with your care.  -LIVER DISEASE: Your liver is enlarged but not causing any symptoms or dysfunction. We have ordered a liver scan to check for any underlying issues, which will be done at the imaging center in [place].  -ESSENTIAL HYPERTENSION: This is high blood pressure. Your blood pressure has improved after stopping your blood pressure medication, which has also reduced your dizziness.  INSTRUCTIONS: Please follow up with the liver scan as scheduled. Continue to monitor your blood pressure and report any significant changes. Ensure that personal care services are arranged to support your safety and comfort at home.

## 2024-03-15 NOTE — Assessment & Plan Note (Signed)
 Patient with history of hypertension, in early November we tried to restart a low-dose of Hyzaar but she had a poor reaction with hypotension.  We discontinued the antihypertensives 2 weeks ago and her blood pressure has returned to normal.  Given her declining functional status, dementia, and frailty, the risks of further antihypertensives likely outweigh the benefit.  Will continue to monitor and only treat if she has severe hypertension in the future.

## 2024-03-15 NOTE — Assessment & Plan Note (Signed)
 Finding functional status over several years seems more consistent with a major neurocognitive disorder, dementia, most likely due to Alzheimer's given her exam and lack of other risk factors.  We tried donepezil  but she could not tolerate it because of nausea and vomiting.  She has family support from her son, daughter, but not 24-hour supervision as everybody has to work.  We completed forms for personal care services which hopefully will be starting soon.  No safety concerns.  We talked about reasonable expectations.  Her daughter has experience working in a memory care facility, and recognizes many of the behaviors that her mother is exhibiting.  It is a shame to see at her young age.

## 2024-03-15 NOTE — Progress Notes (Signed)
 Established Patient Office Visit  Patient ID: Jhayla Podgorski, female    DOB: Mar 26, 1961  Age: 63 y.o. MRN: [id number] PCP: Jerrell Cleatus Ned, MD  Chief Complaint  Patient presents with   Hypertension    4 week follow up     Subjective:     HPI  Discussed the use of AI scribe software for clinical note transcription with the patient, who gave verbal consent to proceed.  History of Present Illness Chrisanna Mishra is a 63 year old female who presents with concerns about memory issues and liver health.  She is experiencing memory issues that have become a significant concern for her and her family. Her daughter, who has experience working in memory care, notes signs consistent with memory decline. Previous medication trials were not well tolerated, and there is no current treatment to reverse the decline. The family is focused on keeping her safe and comfortable at home, with support from her brother and sister-in-law, who are both busy with work and young children. They are in the process of arranging personal care services to assist with her care at home, but these have not yet been scheduled.  She experiences intermittent headaches that 'come and go', though there is no specific mention of their frequency or severity. These headaches are noted as a concern.  Regarding liver health, there is a concern about potential enlargement. Previous records from a gynecologist were obtained, but there is a lack of records regarding her liver, necessitating further testing. Recent blood work from a few weeks ago was stable.  Her current medications include Cymbalta  for pain, simvastatin for cholesterol, and B12 supplements. She has stopped taking blood pressure medication, which has improved her dizziness and blood pressure readings.     Objective:     BP 127/63   Pulse 86   Wt 156 lb (70.8 kg)   LMP 05/10/2011   SpO2 100%   BMI 28.53 kg/m   Physical Exam  Gen: Well-appearing  woman Neck: Normal thyroid, no nodules or adenopathy Heart: Regular, no murmur Abd: Soft, nontender, hepatomegaly noted about 2 cm below the costal border. Neuro: Alert, full strength upper and lower extremities, no asterixis Skin: No spider angioma, no palmar erythema, no jaundice    Assessment & Plan:   Problem List Items Addressed This Visit       High   Essential hypertension (Chronic)   Patient with history of hypertension, in early November we tried to restart a low-dose of Hyzaar but she had a poor reaction with hypotension.  We discontinued the antihypertensives 2 weeks ago and her blood pressure has returned to normal.  Given her declining functional status, dementia, and frailty, the risks of further antihypertensives likely outweigh the benefit.  Will continue to monitor and only treat if she has severe hypertension in the future.      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  We tried donepezil  but she could not tolerate it because of nausea and vomiting.  She has family support from her son, daughter, but not 24-hour supervision as everybody has to work.  We completed forms for personal care services which hopefully will be starting soon.  No safety concerns.  We talked about reasonable expectations.  Her daughter has experience working in a memory care facility, and recognizes  many of the behaviors that her mother is exhibiting.  It is a shame to see at her young age.      Liver disease - Primary   Family reports some type of history of liver disease, but unclear.  On exam she has at least moderate hepatomegaly.  Labs do suggest intermediate risk for advanced fibrosis with mild thrombocytopenia, intermediate fib 4 score.  Will obtain abdominal ultrasound,  look for splenomegaly, and complete elastography of the liver.  She has no clinical signs of cirrhosis, normal bilirubin on last labs.  Limited things that we can do for her, but family wants to complete the workup of her liver health.  We completed an AFP which was normal, if the ultrasound is normal that will effectively screen for [facility].  If the elastography has high risk for advanced fibrosis, will refer to GI to consider endoscopy for variceal screening.  Also would need to consider more workup to look for the underlying cause of liver disease, no history of viral hepatitis, she had normal serum iron levels, no history of significant alcohol use, she is a little overweight but no other metabolic risk factors.      Relevant Orders   US  ABDOMEN COMPLETE W/ELASTOGRAPHY    Return in about 3 months (around 06/13/2024).    Cleatus Debby Specking, MD [facility]

## 2024-03-15 NOTE — Assessment & Plan Note (Addendum)
 Family reports some type of history of liver disease, but unclear.  On exam she has at least moderate hepatomegaly.  Labs do suggest intermediate risk for advanced fibrosis with mild thrombocytopenia, intermediate fib 4 score.  Will obtain abdominal ultrasound, look for splenomegaly, and complete elastography of the liver.  She has no clinical signs of cirrhosis, normal bilirubin on last labs.  Limited things that we can do for her, but family wants to complete the workup of her liver health.  We completed an AFP which was normal, if the ultrasound is normal that will effectively screen for [facility].  If the elastography has high risk for advanced fibrosis, will refer to GI to consider endoscopy for variceal screening.  Also would need to consider more workup to look for the underlying cause of liver disease, no history of viral hepatitis, she had normal serum iron levels, no history of significant alcohol use, she is a little overweight but no other metabolic risk factors.

## 2024-04-03 ENCOUNTER — Ambulatory Visit (HOSPITAL_BASED_OUTPATIENT_CLINIC_OR_DEPARTMENT_OTHER)
Admission: RE | Admit: 2024-04-03 | Discharge: 2024-04-03 | Disposition: A | Discharge status: Home / Self Care | Source: Ambulatory Visit | Attending: Student in an Organized Health Care Education/Training Program | Admitting: Student in an Organized Health Care Education/Training Program

## 2024-04-03 DIAGNOSIS — K769 Liver disease, unspecified: Secondary | ICD-10-CM | POA: Insufficient documentation

## 2024-04-09 ENCOUNTER — Telehealth: Payer: Self-pay

## 2024-04-09 NOTE — Telephone Encounter (Signed)
 [employer] forms received yesterday and placed on providers desk.  Forms returned back to [facility] and has been faxed back and placed in scan folder upfront.

## 2024-04-19 ENCOUNTER — Ambulatory Visit: Payer: Self-pay | Admitting: Student in an Organized Health Care Education/Training Program

## 2024-04-23 NOTE — Telephone Encounter (Signed)
 Faxed back to requested number

## 2024-04-23 NOTE — Telephone Encounter (Unsigned)
 Copied from CRM #[id number]. Topic: General - Other >> Apr 23, 2024  9:57 AM Aleatha BROCKS wrote: Reason for CRM: [employer] has not received the [employer] forms and asking if can be re faxed to [phone] for patient

## 2024-06-13 ENCOUNTER — Ambulatory Visit: Admitting: Student in an Organized Health Care Education/Training Program
